# Patient Record
Sex: Female | Born: 1955 | Race: Black or African American | Hispanic: No | Marital: Married | Smoking: Current every day smoker
Health system: Southern US, Community
[De-identification: ages and names within clinical notes are randomized; demographics above are authoritative.]

## PROBLEM LIST (undated history)

## (undated) DIAGNOSIS — I10 Essential (primary) hypertension: Secondary | ICD-10-CM

## (undated) DIAGNOSIS — I341 Nonrheumatic mitral (valve) prolapse: Secondary | ICD-10-CM

## (undated) DIAGNOSIS — Z5189 Encounter for other specified aftercare: Secondary | ICD-10-CM

## (undated) DIAGNOSIS — I519 Heart disease, unspecified: Secondary | ICD-10-CM

## (undated) DIAGNOSIS — F32A Depression, unspecified: Secondary | ICD-10-CM

## (undated) DIAGNOSIS — K5010 Crohn's disease of large intestine without complications: Secondary | ICD-10-CM

## (undated) DIAGNOSIS — N979 Female infertility, unspecified: Secondary | ICD-10-CM

## (undated) DIAGNOSIS — F419 Anxiety disorder, unspecified: Secondary | ICD-10-CM

## (undated) DIAGNOSIS — F329 Major depressive disorder, single episode, unspecified: Secondary | ICD-10-CM

## (undated) HISTORY — PX: WISDOM TOOTH EXTRACTION: SHX21

## (undated) HISTORY — DX: Female infertility, unspecified: N97.9

## (undated) HISTORY — DX: Heart disease, unspecified: I51.9

## (undated) HISTORY — DX: Encounter for other specified aftercare: Z51.89

## (undated) HISTORY — DX: Depression, unspecified: F32.A

## (undated) HISTORY — DX: Essential (primary) hypertension: I10

## (undated) HISTORY — DX: Major depressive disorder, single episode, unspecified: F32.9

## (undated) HISTORY — PX: TUBAL LIGATION: SHX77

## (undated) HISTORY — DX: Anxiety disorder, unspecified: F41.9

---

## 2005-04-21 ENCOUNTER — Emergency Department (HOSPITAL_COMMUNITY): Admission: EM | Admit: 2005-04-21 | Discharge: 2005-04-21 | Payer: Self-pay | Admitting: Emergency Medicine

## 2005-07-25 ENCOUNTER — Emergency Department (HOSPITAL_COMMUNITY): Admission: EM | Admit: 2005-07-25 | Discharge: 2005-07-25 | Payer: Self-pay | Admitting: Emergency Medicine

## 2005-08-25 ENCOUNTER — Other Ambulatory Visit: Admission: RE | Admit: 2005-08-25 | Discharge: 2005-08-25 | Payer: Self-pay | Admitting: Obstetrics and Gynecology

## 2006-02-03 ENCOUNTER — Emergency Department (HOSPITAL_COMMUNITY): Admission: EM | Admit: 2006-02-03 | Discharge: 2006-02-03 | Payer: Self-pay | Admitting: Family Medicine

## 2006-05-26 ENCOUNTER — Other Ambulatory Visit: Admission: RE | Admit: 2006-05-26 | Discharge: 2006-05-26 | Payer: Self-pay | Admitting: Family Medicine

## 2007-03-24 ENCOUNTER — Emergency Department (HOSPITAL_COMMUNITY): Admission: EM | Admit: 2007-03-24 | Discharge: 2007-03-24 | Payer: Self-pay | Admitting: Emergency Medicine

## 2009-03-03 ENCOUNTER — Emergency Department (HOSPITAL_BASED_OUTPATIENT_CLINIC_OR_DEPARTMENT_OTHER): Admission: EM | Admit: 2009-03-03 | Discharge: 2009-03-03 | Payer: Self-pay | Admitting: Emergency Medicine

## 2009-03-03 ENCOUNTER — Ambulatory Visit: Payer: Self-pay | Admitting: Diagnostic Radiology

## 2009-05-08 ENCOUNTER — Other Ambulatory Visit: Payer: Self-pay | Admitting: Emergency Medicine

## 2009-05-09 ENCOUNTER — Inpatient Hospital Stay (HOSPITAL_COMMUNITY): Admission: EM | Admit: 2009-05-09 | Discharge: 2009-05-11 | Payer: Self-pay | Admitting: Internal Medicine

## 2009-05-10 ENCOUNTER — Encounter (INDEPENDENT_AMBULATORY_CARE_PROVIDER_SITE_OTHER): Payer: Self-pay | Admitting: Gastroenterology

## 2009-05-21 ENCOUNTER — Encounter (HOSPITAL_COMMUNITY): Admission: RE | Admit: 2009-05-21 | Discharge: 2009-07-21 | Payer: Self-pay | Admitting: Gastroenterology

## 2010-11-19 LAB — CBC
HCT: 33.7 % — ABNORMAL LOW (ref 36.0–46.0)
MCV: 93.1 fL (ref 78.0–100.0)
RBC: 3.62 MIL/uL — ABNORMAL LOW (ref 3.87–5.11)
WBC: 8.1 10*3/uL (ref 4.0–10.5)

## 2010-11-19 LAB — CROSSMATCH

## 2010-11-19 LAB — DIFFERENTIAL
Eosinophils Absolute: 0.1 10*3/uL (ref 0.0–0.7)
Lymphocytes Relative: 35 % (ref 12–46)
Lymphs Abs: 2.8 10*3/uL (ref 0.7–4.0)
Monocytes Relative: 5 % (ref 3–12)
Neutrophils Relative %: 58 % (ref 43–77)

## 2010-11-19 LAB — ABO/RH: ABO/RH(D): O POS

## 2010-11-20 LAB — URINE MICROSCOPIC-ADD ON

## 2010-11-20 LAB — CROSSMATCH
ABO/RH(D): O POS
Antibody Screen: NEGATIVE

## 2010-11-20 LAB — COMPREHENSIVE METABOLIC PANEL
ALT: 12 U/L (ref 0–35)
AST: 15 U/L (ref 0–37)
AST: 20 U/L (ref 0–37)
Albumin: 3.5 g/dL (ref 3.5–5.2)
Alkaline Phosphatase: 74 U/L (ref 39–117)
BUN: 12 mg/dL (ref 6–23)
CO2: 30 mEq/L (ref 19–32)
Calcium: 8.8 mg/dL (ref 8.4–10.5)
Chloride: 107 mEq/L (ref 96–112)
Chloride: 106 mEq/L (ref 96–112)
Creatinine, Ser: 0.93 mg/dL (ref 0.4–1.2)
GFR calc Af Amer: >60 mL/min (ref >60)
GFR calc Af Amer: >60 mL/min (ref >60)
GFR calc non Af Amer: >60 mL/min (ref >60)
Glucose, Bld: 99 mg/dL (ref 70–99)
Potassium: 3.5 mEq/L (ref 3.5–5.1)
Sodium: 141 mEq/L (ref 135–145)
Total Bilirubin: 0.4 mg/dL (ref 0.3–1.2)
Total Bilirubin: 0.2 mg/dL — ABNORMAL LOW (ref 0.3–1.2)
Total Protein: 6.8 g/dL (ref 6.0–8.3)

## 2010-11-20 LAB — CBC
HCT: 34.7 % — ABNORMAL LOW (ref 36.0–46.0)
Hemoglobin: 10.4 g/dL — ABNORMAL LOW (ref 12.0–15.0)
MCHC: 32.5 g/dL (ref 30.0–36.0)
MCHC: 32.8 g/dL (ref 30.0–36.0)
MCHC: 32.9 g/dL (ref 30.0–36.0)
MCV: 92.2 fL (ref 78.0–100.0)
MCV: 92.8 fL (ref 78.0–100.0)
MCV: 93.1 fL (ref 78.0–100.0)
Platelets: 200 10*3/uL (ref 150–400)
Platelets: 228 10*3/uL (ref 150–400)
RBC: 3.45 MIL/uL — ABNORMAL LOW (ref 3.87–5.11)
RBC: 3.73 MIL/uL — ABNORMAL LOW (ref 3.87–5.11)
RDW: 12.8 % (ref 11.5–15.5)
WBC: 6.3 10*3/uL (ref 4.0–10.5)
WBC: 7.6 10*3/uL (ref 4.0–10.5)

## 2010-11-20 LAB — BASIC METABOLIC PANEL
CO2: 25 mEq/L (ref 19–32)
Chloride: 109 mEq/L (ref 96–112)
Creatinine, Ser: 0.83 mg/dL (ref 0.4–1.2)
GFR calc Af Amer: >60 mL/min (ref >60)
Sodium: 140 mEq/L (ref 135–145)

## 2010-11-20 LAB — DIFFERENTIAL
Basophils Absolute: 0 10*3/uL (ref 0.0–0.1)
Basophils Relative: 1 % (ref 0–1)
Eosinophils Relative: 2 % (ref 0–5)
Lymphocytes Relative: 55 % — ABNORMAL HIGH (ref 12–46)
Lymphs Abs: 4.1 10*3/uL — ABNORMAL HIGH (ref 0.7–4.0)
Monocytes Absolute: 0.4 10*3/uL (ref 0.1–1.0)
Monocytes Relative: 5 % (ref 3–12)
Neutro Abs: 2.9 10*3/uL (ref 1.7–7.7)
Neutro Abs: 3.2 10*3/uL (ref 1.7–7.7)
Neutrophils Relative %: 38 % — ABNORMAL LOW (ref 43–77)

## 2010-11-20 LAB — HEMOGLOBIN AND HEMATOCRIT, BLOOD
HCT: 30.2 % — ABNORMAL LOW (ref 36.0–46.0)
Hemoglobin: 9.4 g/dL — ABNORMAL LOW (ref 12.0–15.0)
Hemoglobin: 9.7 g/dL — ABNORMAL LOW (ref 12.0–15.0)

## 2010-11-20 LAB — URINALYSIS, ROUTINE W REFLEX MICROSCOPIC
Bilirubin Urine: NEGATIVE
Glucose, UA: NEGATIVE mg/dL
Leukocytes, UA: NEGATIVE
Nitrite: NEGATIVE
Specific Gravity, Urine: 1.009 (ref 1.005–1.030)
pH: 6 (ref 5.0–8.0)

## 2010-11-20 LAB — HEMOCCULT GUIAC POC 1CARD (OFFICE): Fecal Occult Bld: POSITIVE

## 2010-11-20 LAB — APTT: aPTT: 26 seconds (ref 24–37)

## 2010-11-22 LAB — URINALYSIS, ROUTINE W REFLEX MICROSCOPIC
Ketones, ur: NEGATIVE mg/dL
Protein, ur: NEGATIVE mg/dL
Urobilinogen, UA: 0.2 mg/dL (ref 0.0–1.0)

## 2010-11-22 LAB — COMPREHENSIVE METABOLIC PANEL
ALT: 9 U/L (ref 0–35)
AST: 25 U/L (ref 0–37)
Calcium: 9.1 mg/dL (ref 8.4–10.5)
GFR calc Af Amer: >60 mL/min (ref >60)
Sodium: 141 mEq/L (ref 135–145)
Total Protein: 7.8 g/dL (ref 6.0–8.3)

## 2010-11-22 LAB — DIFFERENTIAL
Eosinophils Absolute: 0.1 10*3/uL (ref 0.0–0.7)
Eosinophils Relative: 2 % (ref 0–5)
Lymphs Abs: 3.1 10*3/uL (ref 0.7–4.0)
Monocytes Absolute: 0.2 10*3/uL (ref 0.1–1.0)
Monocytes Relative: 3 % (ref 3–12)

## 2010-11-22 LAB — CBC
MCHC: 33.9 g/dL (ref 30.0–36.0)
RBC: 4 MIL/uL (ref 3.87–5.11)
RDW: 12.7 % (ref 11.5–15.5)

## 2010-11-22 LAB — GLUCOSE, CAPILLARY: Glucose-Capillary: 115 mg/dL — ABNORMAL HIGH (ref 70–99)

## 2010-11-22 LAB — URINE MICROSCOPIC-ADD ON

## 2011-02-01 IMAGING — CT CT HEAD W/O CM
1 series · 16 of 30 positions shown, 20 images · non-contrast
Comparison: None.

CLINICAL DATA: Dizziness.  Left arm, leg and facial numbness and
tingling since yesterday.  Hypertension.  Anxiety.  Obesity.

CT HEAD WITHOUT CONTRAST
TECHNIQUE: Contiguous axial images were obtained from the base of
the skull through the vertex without contrast.

[Series 2: head 4.8 h37s · axial · 0.44mm/px · z∈[-92,+44]mm · 16 of 32 slices shown, 20 images]
[im 2/32  brain]
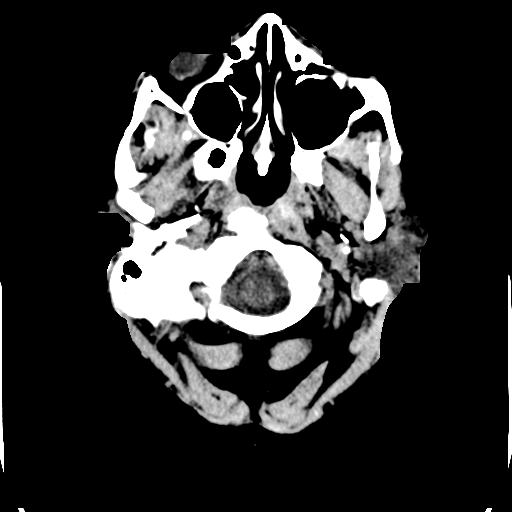
[im 2/32  bone]
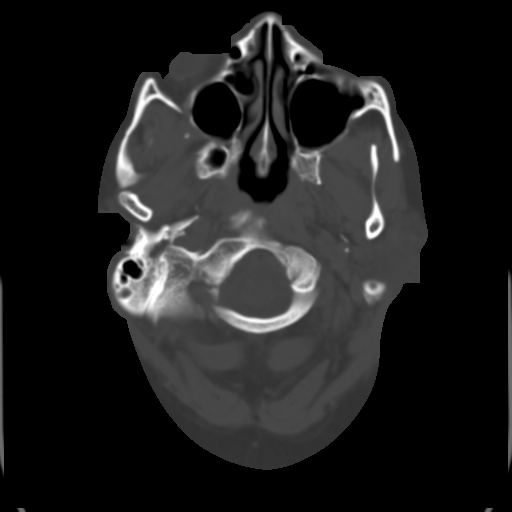
[im 4/32  brain]
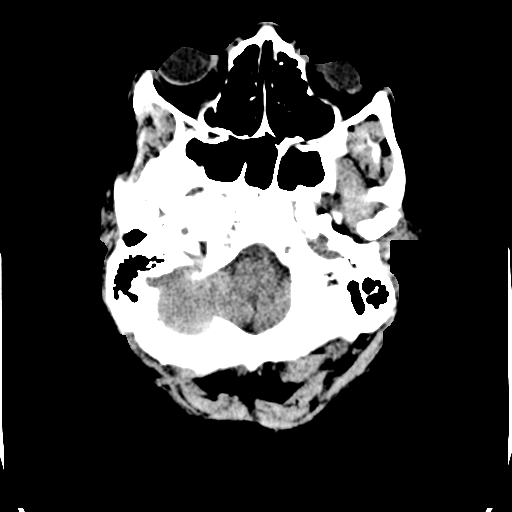
[im 6/32  brain]
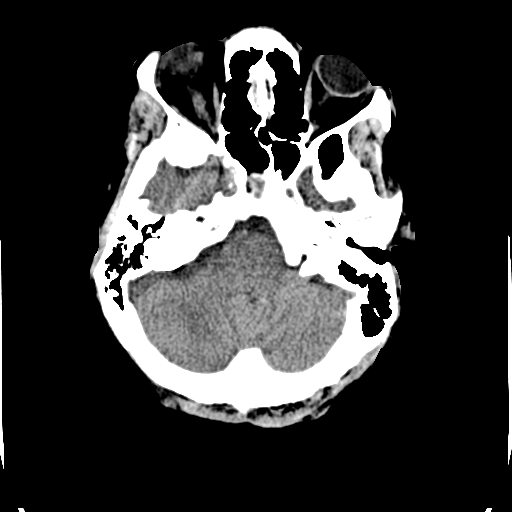
[im 8/32  brain]
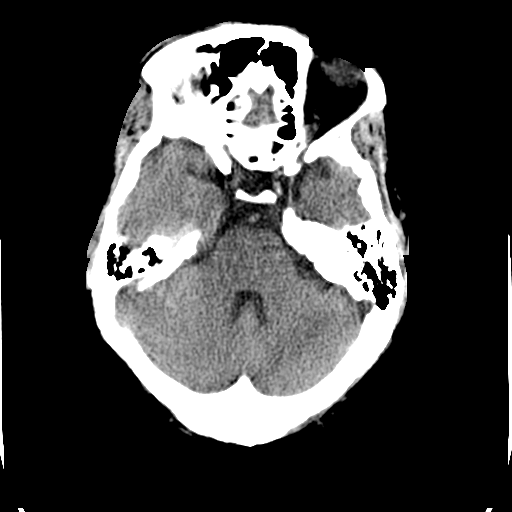
[im 9/32  brain]
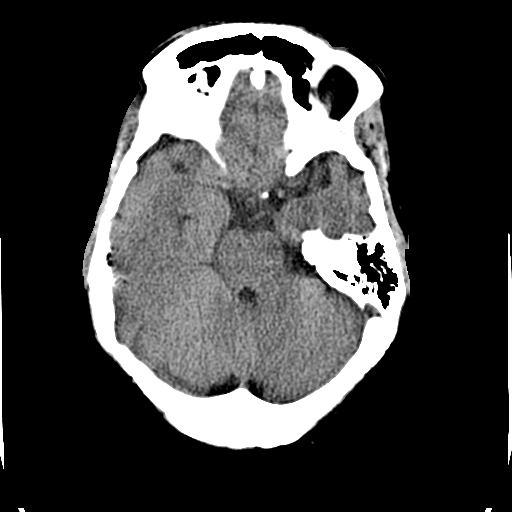
[im 9/32  bone]
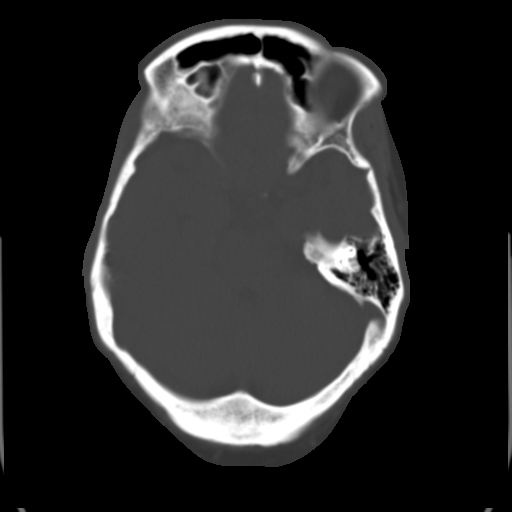
[im 11/32  brain]
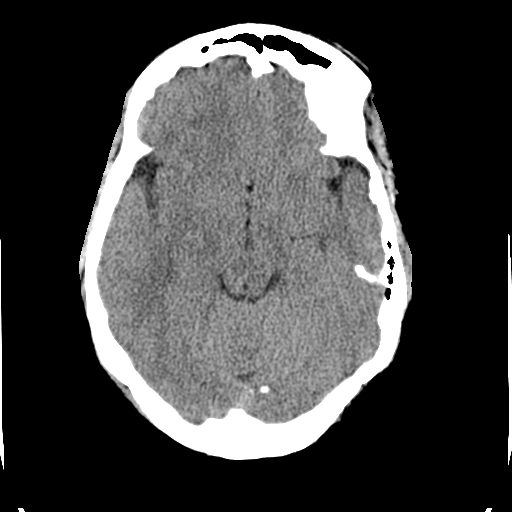
[im 13/32  brain]
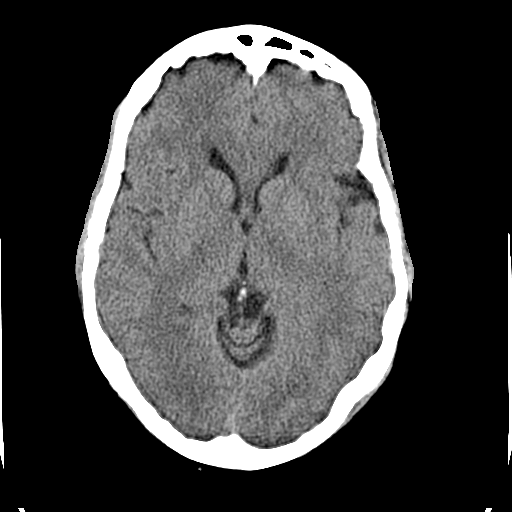
[im 15/32  brain]
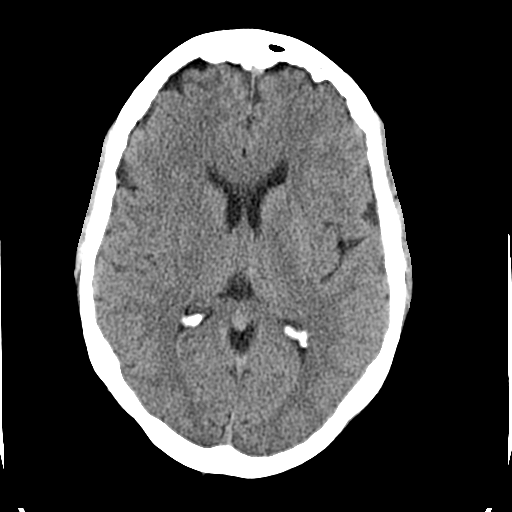
[im 17/32  brain]
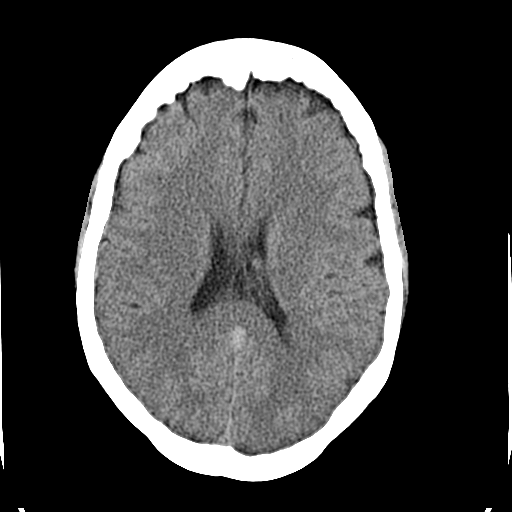
[im 17/32  bone]
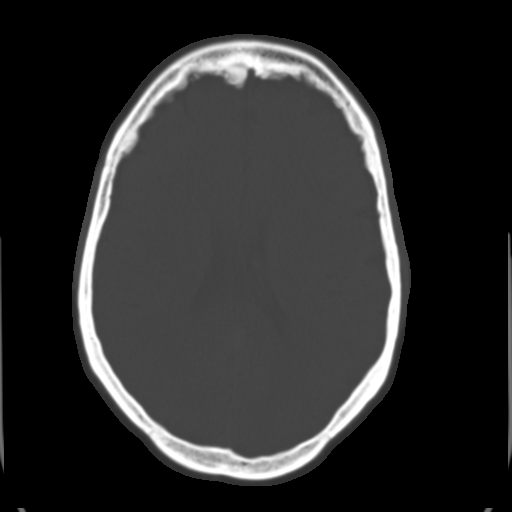
[im 19/32  brain]
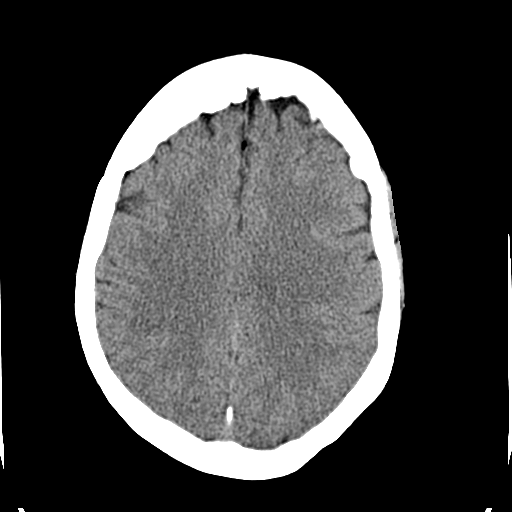
[im 21/32  brain]
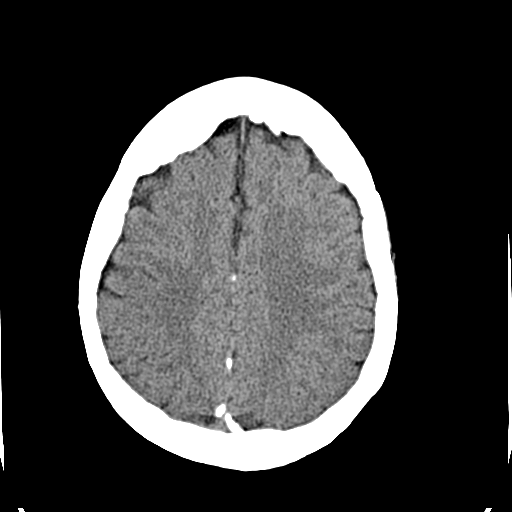
[im 23/32  brain]
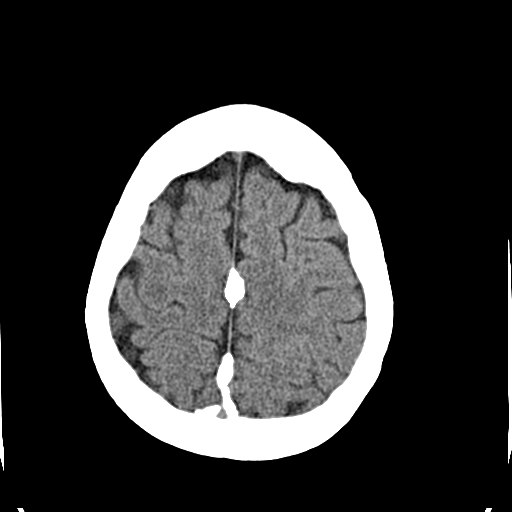
[im 24/32  brain]
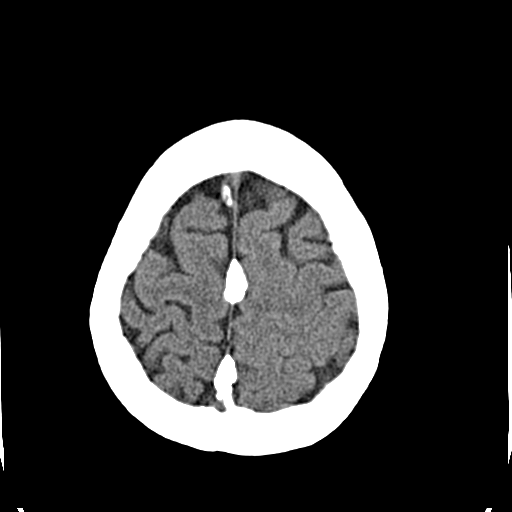
[im 24/32  bone]
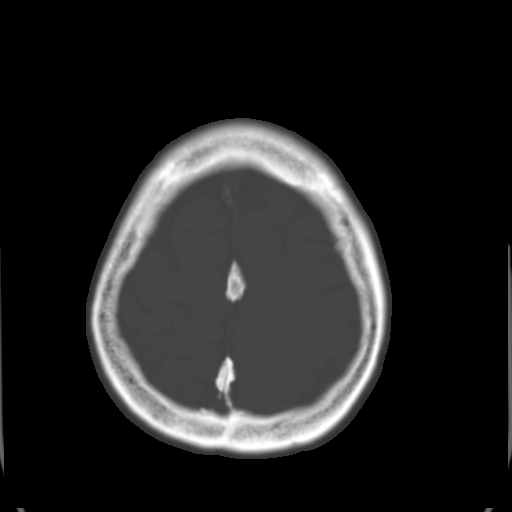
[im 26/32  brain]
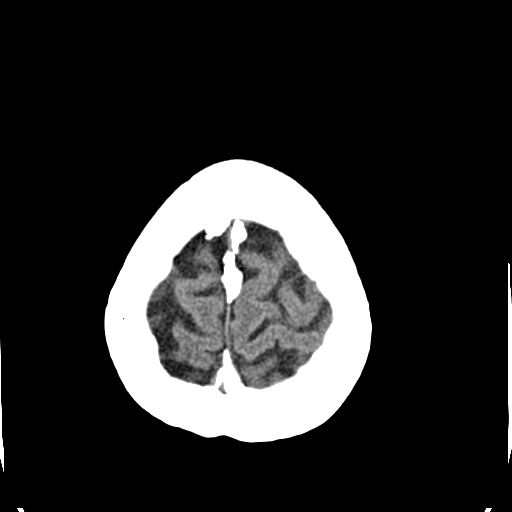
[im 28/32  brain]
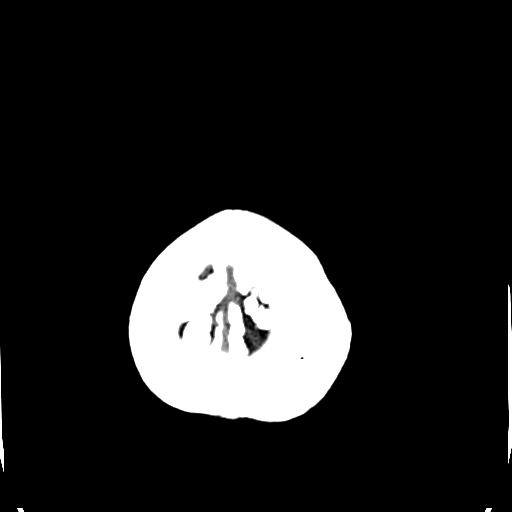
[im 30/32  brain]
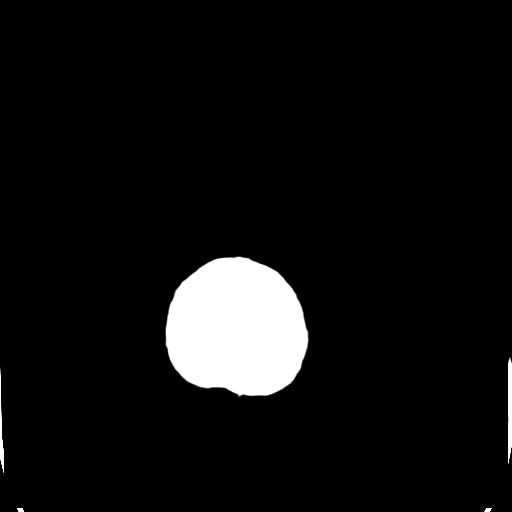

[16 of 30 positions shown; findings below may reference images not displayed]

FINDINGS: Normal appearing cerebral hemispheres and posterior fossa
structures.  Normal size and position of the ventricles.  No
intracranial hemorrhage, mass lesion or evidence of acute
infarction.  Unremarkable bones and included portions of the
paranasal sinuses.
IMPRESSION: Normal examination.

## 2011-05-31 LAB — I-STAT 8, (EC8 V) (CONVERTED LAB)
Acid-Base Excess: 2
BUN: 16
Chloride: 105
HCT: 43
Hemoglobin: 14.6
Operator id: 282151
Potassium: 3.7
Sodium: 140
pCO2, Ven: 43.3 — ABNORMAL LOW

## 2011-05-31 LAB — POCT I-STAT CREATININE
Creatinine, Ser: 1.1
Operator id: 282151

## 2013-12-14 ENCOUNTER — Observation Stay: Payer: Self-pay | Admitting: Internal Medicine

## 2013-12-14 LAB — BASIC METABOLIC PANEL
ANION GAP: 6 — AB (ref 7–16)
BUN: 10 mg/dL (ref 7–18)
CO2: 29 mmol/L (ref 21–32)
Calcium, Total: 9.1 mg/dL (ref 8.5–10.1)
Chloride: 105 mmol/L (ref 98–107)
Creatinine: 0.81 mg/dL (ref 0.60–1.30)
GLUCOSE: 83 mg/dL (ref 65–99)
Osmolality: 278 (ref 275–301)
POTASSIUM: 3.9 mmol/L (ref 3.5–5.1)
Sodium: 140 mmol/L (ref 136–145)

## 2013-12-14 LAB — HEPATIC FUNCTION PANEL A (ARMC)
ALK PHOS: 52 U/L
Albumin: 2.9 g/dL — ABNORMAL LOW (ref 3.4–5.0)
BILIRUBIN TOTAL: 0.3 mg/dL (ref 0.2–1.0)
Bilirubin, Direct: <0.1 mg/dL (ref 0.00–0.20)
SGOT(AST): 15 U/L (ref 15–37)
SGPT (ALT): 13 U/L (ref 12–78)
TOTAL PROTEIN: 8 g/dL (ref 6.4–8.2)

## 2013-12-14 LAB — TROPONIN I
Troponin-I: <0.02 ng/mL
Troponin-I: <0.02 ng/mL

## 2013-12-14 LAB — CBC
HCT: 40.7 % (ref 35.0–47.0)
HGB: 13 g/dL (ref 12.0–16.0)
MCH: 29 pg (ref 26.0–34.0)
MCHC: 32 g/dL (ref 32.0–36.0)
MCV: 91 fL (ref 80–100)
Platelet: 208 10*3/uL (ref 150–440)
RBC: 4.49 10*6/uL (ref 3.80–5.20)
RDW: 13.5 % (ref 11.5–14.5)
WBC: 5.6 10*3/uL (ref 3.6–11.0)

## 2013-12-14 LAB — CK-MB
CK-MB: 0.6 ng/mL (ref 0.5–3.6)
CK-MB: 0.8 ng/mL (ref 0.5–3.6)

## 2013-12-14 LAB — LIPID PANEL
CHOLESTEROL: 229 mg/dL — AB (ref 0–200)
HDL Cholesterol: 62 mg/dL — ABNORMAL HIGH (ref 40–60)
LDL CHOLESTEROL, CALC: 154 mg/dL — AB (ref 0–100)
Triglycerides: 64 mg/dL (ref 0–200)
VLDL Cholesterol, Calc: 13 mg/dL (ref 5–40)

## 2013-12-14 LAB — LIPASE, BLOOD: LIPASE: 104 U/L (ref 73–393)

## 2014-01-17 DIAGNOSIS — R079 Chest pain, unspecified: Secondary | ICD-10-CM | POA: Insufficient documentation

## 2014-01-17 DIAGNOSIS — E785 Hyperlipidemia, unspecified: Secondary | ICD-10-CM | POA: Insufficient documentation

## 2014-01-17 DIAGNOSIS — I10 Essential (primary) hypertension: Secondary | ICD-10-CM | POA: Insufficient documentation

## 2014-01-17 DIAGNOSIS — I341 Nonrheumatic mitral (valve) prolapse: Secondary | ICD-10-CM | POA: Insufficient documentation

## 2014-12-07 NOTE — Discharge Summary (Signed)
PATIENT NAME:  Carla BussingWILSON FOWLKES, Jesse MR#:  [id number] DATE OF BIRTH:  [birth year]  DATE OF ADMISSION:  12/14/2013 DATE OF DISCHARGE:  12/15/2013  CHIEF COMPLAINT: Chest pain.   DISCHARGE DIAGNOSES:  1. Chest pain, resolved, appears atypical.  2. Rash.  3. Hypertension.  4. Hyperlipidemia.  5. Tobacco abuse.   CODE STATUS: Full code.   MEDICATIONS:  1. Pravastatin 10 mg at bedtime.  2. Hydrochlorothiazide/lisinopril 12.5/20 one tablet b.i.d.   3. Metoprolol 25 mg b.i.d.  4. Amlodipine 10 mg daily.  5. Triamcinolone 0.5% topical ointment 1 apply b.i.d.   DISCHARGE DIET: Low-sodium.   DISCHARGE INSTRUCTIONS:  Establish primary care physician in the area.   LABORATORY DATA: Cardiac enzymes x 3 negative. Myoview stress test within normal limits.   BRIEF SUMMARY OF HOSPITAL COURSE: The patient is a 59 year old pleasant African American female with history of tobacco abuse and hypertension comes in with chest pain, which appeared to be atypical. She did not have any acute EKG changes. Cardiac enzymes remained negative. She was continued on beta blockers and statins.  UNABLE TO GIVE ASPIRIN DUE TO ALLERGY. She did not have any episodes of chest pain.  1. Axillary hypertension. The patient was advised low-salt diet, exercise, weight reduction, and started on beta blockers, hydrochlorothiazide/lisinopril and amlodipine.  2. Hyperlipidemia. On statins.  3. Bilateral lower extremity itchy rash appears to be, along with hyperpigmented patches, likely eczema. Triamcinolone ointment was given.   Hospital stay otherwise remained stable.   CODE STATUS: The patient remained a full code.   TIME SPENT: 40 minutes.    ____________________________ Wylie HailSona A. Allena KatzPatel, MD sap:dd/am D: 12/18/2013 14:33:12 ET T: 12/19/2013 00:00:32 ET JOB#: [id number]  cc: Siera Beyersdorf A. Allena KatzPatel, MD, <Dictator> Willow OraSONA A Vicky Schleich MD ELECTRONICALLY SIGNED 01/06/2014 16:11

## 2014-12-07 NOTE — Consult Note (Signed)
PATIENT NAME:  Carla Tyler, Carla Tyler MR#:  [id number] DATE OF BIRTH:  1956-07-16  DATE OF CONSULTATION:  12/15/2013  CONSULTING PHYSICIAN:  Marcina MillardAlexander Yatzari Jonsson, MD  PRIMARY CARE PHYSICIAN: None.  CHIEF COMPLAINT: Lower extremity rash.   REASON FOR CONSULTATION: Consultation requested for evaluation of chest discomfort.   HISTORY OF PRESENT ILLNESS: The patient is a 59 year old female with history of hypertension, hyperlipidemia, and mitral valve prolapse. The patient presented to [facility] Emergency Room with chief complaint of rash in both lower extremities. In the Emergency Room, the patient reports that she has had some intermittent chest discomfort. The patient has had a history of mitral valve prolapse and intermittent chest discomfort described as a tightness sensation. In the Emergency Room, she reported that she had some radiation to her jaw. The patient reports that she has had prior chest pain in the past. The patient was admitted to telemetry, where she was ruled out for myocardial infarction by CPK, isoenzymes, and troponin. The patient has not been taking her medications or seeing a primary doctor due to financial concerns. The patient underwent ECD sestamibi study earlier today. The patient did not experience any chest pain on the treadmill. Sestamibi scintigraphy revealed a moderate reversible apical wall defect consistent with ischemia.   PAST MEDICAL HISTORY: 1.  Hypertension.  2.  Hyperlipidemia.  3.  Mitral valve prolapse.  4.  Anxiety.   MEDICATIONS: None.   SOCIAL HISTORY: The patient is married. She smokes less than a pack of cigarettes a day.   FAMILY HISTORY: Positive for coronary artery disease.   REVIEW OF SYSTEMS:    CONSTITUTIONAL: No fever or chills.  EYES: No blurry vision.  EARS: No hearing loss.  RESPIRATORY: No shortness of breath.  CARDIOVASCULAR: Chest discomfort with mitral valve prolapse as described above.  GASTROINTESTINAL: No nausea, vomiting, or  diarrhea.  GENITOURINARY: No dysuria or hematuria.  ENDOCRINE: No polyuria or polydipsia.  MUSCULOSKELETAL: No arthralgias or myalgias.  INTEGUMENTARY: The patient has had a rash over both lower extremities and over her shoulders.  NEUROLOGICAL: No focal muscle weakness or numbness.  PSYCHOLOGICAL: No depression or anxiety.   PHYSICAL EXAMINATION: VITAL SIGNS: Blood pressure 137/87, pulse 61, respirations 18, temperature 97.7, pulse oximetry 96%.  HEENT: Pupils equal and reactive to light and accommodation.  NECK: Supple without thyromegaly.  LUNGS: Clear.  HEART: Normal JVP. Normal PMI. Regular rate and rhythm. Normal S1, S2. No appreciable gallop, murmur, or rub.  ABDOMEN: Soft, nontender.  EXTREMITIES: Pulses were intact bilaterally.  MUSCULOSKELETAL: Normal muscle tone.  NEUROLOGICAL: The patient is alert and oriented x 3. Motor and sensory both grossly intact.   IMPRESSION: This is a 59 year old female with known history of hypertension, hyperlipidemia, has been lost to medical followup, currently on no medications, who presents primarily with rash, with intermittent chest pain with typical and atypical features, with ECD sestamibi of low to intermediate risk. Blood pressure much improved after starting medications.   RECOMMENDATIONS: 1.  Agree with overall current therapy.  2.  Would defer therapeutic heparin. 3.  Will defer cardiac catheterization at this time. Will follow up in one week. If patient continues to have symptoms of chest pain, then we will proceed with cardiac catheterization.    ____________________________ Marcina MillardAlexander Tina Temme, MD ap:jcm D: 12/15/2013 14:45:56 ET T: 12/15/2013 19:00:51 ET JOB#: [id number]  cc: Marcina MillardAlexander Wenceslao Loper, MD, <Dictator> Marcina MillardALEXANDER Luwana Butrick MD ELECTRONICALLY SIGNED 01/14/2014 15:02

## 2014-12-07 NOTE — H&P (Signed)
PATIENT NAME:  Tyler Tyler, Britt MR#:  [id number] DATE OF BIRTH:  May 21, 1956  DATE OF ADMISSION:  12/14/2013  PRIMARY CARE PHYSICIAN:  None.  CHIEF COMPLAINT: Chest pain for 2 days. Rash on bilateral lower extremity.   HISTORY OF PRESENT ILLNESS: Tyler Tyler is a 59 year old morbidly obese African American female with history of hypertension, hyperlipidemia, which has been untreated due to financial reasons, comes to the Emergency Room after she started having some chest discomfort and "fluttering of her heart yesterday." The patient said the symptoms faded out on its own yesterday, came back again this morning with some chest tightness. She is chest pain free right now. She has history of mitral valve prolapse. The patient also has developed some itchy rash over her bilateral shoulders and bilateral ankle area. She is hemodynamically stable. Her EKG does not show any acute ST elevation or depression. First set of cardiac enzymes is negative. She is being admitted for further evaluation and management.   PAST MEDICAL HISTORY: 1.  Hypertension.  2.  Hyperlipidemia.  3.  Mitral valve prolapse.  4.  Anxiety.   MEDICATIONS: None.   ALLERGIES: ASPIRIN AND PENICILLIN.   FAMILY HISTORY: Positive for CAD and diabetes.   SOCIAL HISTORY: The patient smokes about less than a pack a day. Denies alcohol or drug use.  REVIEW OF SYSTEMS:  CONSTITUTIONAL: No fever, fatigue, weakness.  EYES: No blurred or double vision.  EARS, NOSE, THROAT:  No tinnitus, ear pain, hearing loss.  RESPIRATORY: No cough, wheeze, hemoptysis.  CARDIOVASCULAR: Positive for chest pain earlier today. Positive for hypertension. No arrhythmia or syncope.  GASTROINTESTINAL: No nausea, vomiting, diarrhea, abdominal pain or melena. GENITOURINARY:  No dysuria, hematuria or frequency.  ENDOCRINE: No polyuria, nocturia or thyroid problems.  HEMATOLOGY: No anemia or easy bruising.  SKIN: Positive for some itchy rash over the  shoulders and bilateral ankle.  NEUROLOGIC: No CVA, TIA, dysarthria or vertigo.  PSYCHIATRIC: No anxiety or depression.  All other systems reviewed and negative.   PHYSICAL EXAMINATION: GENERAL: The patient is awake, alert, oriented x 3, not in any acute distress.  VITAL SIGNS: Afebrile. Pulse is 68. Blood pressure is 199/98, sats are 98% on room air.  HEENT: Atraumatic, normocephalic. PERRLA. EOM intact. Oral mucosa is moist.  NECK: Supple. No JVD. No carotid bruit.  RESPIRATORY: Clear to auscultation bilaterally. No rales, rhonchi, respiratory distress or labored breathing.  CARDIOVASCULAR: Both heart sounds are normal. Rate, rhythm is regular. PMI not lateralized. Chest nontender.  EXTREMITIES: Good pedal pulses. Good femoral pulses. No lower extremity edema.  ABDOMEN: Soft, benign, nontender. No organomegaly. Positive bowel sounds.  NEUROLOGIC: Grossly intact cranial nerves II through XII. No motor or sensory deficit.  PSYCHIATRIC: The patient is awake, alert, oriented x 3.  SKIN: The patient has some itchy eczematous rash, patchy areas of rash in bilateral lower extremities, more above the left lateral malleolus. She also some patchy rash around the bilateral shoulders.  EKG shows normal sinus rhythm, LVH. Some nonspecific ST and T changes.  Chest x-ray shows no acute cardiopulmonary abnormality. Bullous disease on the right apex. Troponin is 0.02. CBC and basic metabolic panel within normal limits. Hepatic function panel within normal limits. Lipase is 104. Cholesterol is 229, triglycerides 64, LDL is 154.   ASSESSMENT AND PLAN: A 59 year old, Tyler Tyler, with past medical history of hypertension, hyperlipidemia and anxiety comes in with:  1.  Chest pain. Appears to be atypical at this time. The patient, however, has risk factor of  hypertension and hyperlipidemia. We will admit her on telemetry floor, cycle cardiac enzymes x 3. Continue aspirin and beta blockers, statins, as-needed  nitroglycerin.  4.  Accelerated uncontrolled, untreated hypertension. Will start the patient on beta blockers, hydrochlorothiazide/lisinopril. The patient is advised low-salt diet and exercise with weight reduction.  5.  Hyperlipidemia. Statins will be started.  6.  Deep vein thrombosis prophylaxis. Subcutaneous heparin three times daily.  7.  Further workup per the patient's clinical course. Hospital admission plan was discussed with the patient. 8.  Bilateral lower extremity itchy rash appears to be with hyperpigmented patches, likely is eczema. We will start her on some triamcinolone ointment.    TIME SPENT: 40 minutes.    ____________________________ Wylie Hail Allena Katz, MD sap:ce D: 12/14/2013 15:09:31 ET T: 12/14/2013 16:05:44 ET JOB#: [id number]  cc: Azari Hasler A. Allena Katz, MD, <Dictator> Willow Ora MD ELECTRONICALLY SIGNED 01/06/2014 16:10

## 2015-01-23 ENCOUNTER — Other Ambulatory Visit: Payer: Self-pay | Admitting: Family Medicine

## 2015-01-23 DIAGNOSIS — Z1231 Encounter for screening mammogram for malignant neoplasm of breast: Secondary | ICD-10-CM

## 2015-01-29 ENCOUNTER — Ambulatory Visit: Payer: Self-pay

## 2015-11-14 IMAGING — CR DG CHEST 2V
1 series · 3 of 3 positions shown · non-contrast
Comparison: None.

CLINICAL DATA: Chest pain

EXAM:
CHEST  2 VIEW

[Series 1: w chest pa · 0.14mm/px · 3 of 3 slices shown]
[im 1/3]
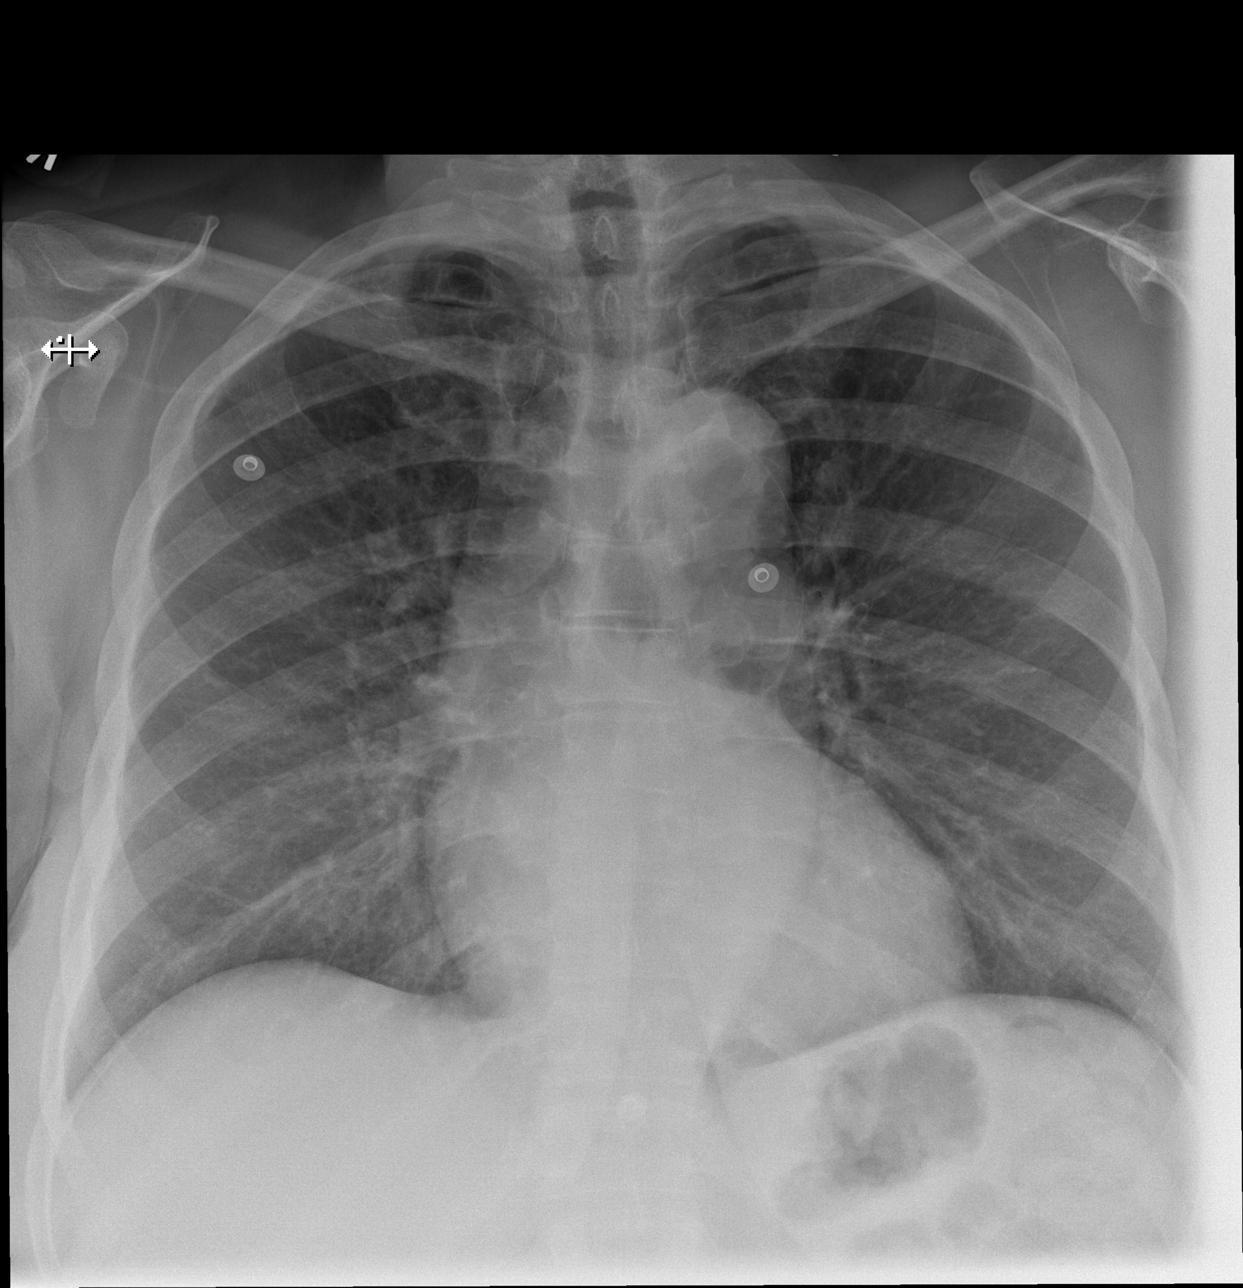
[im 2/3]
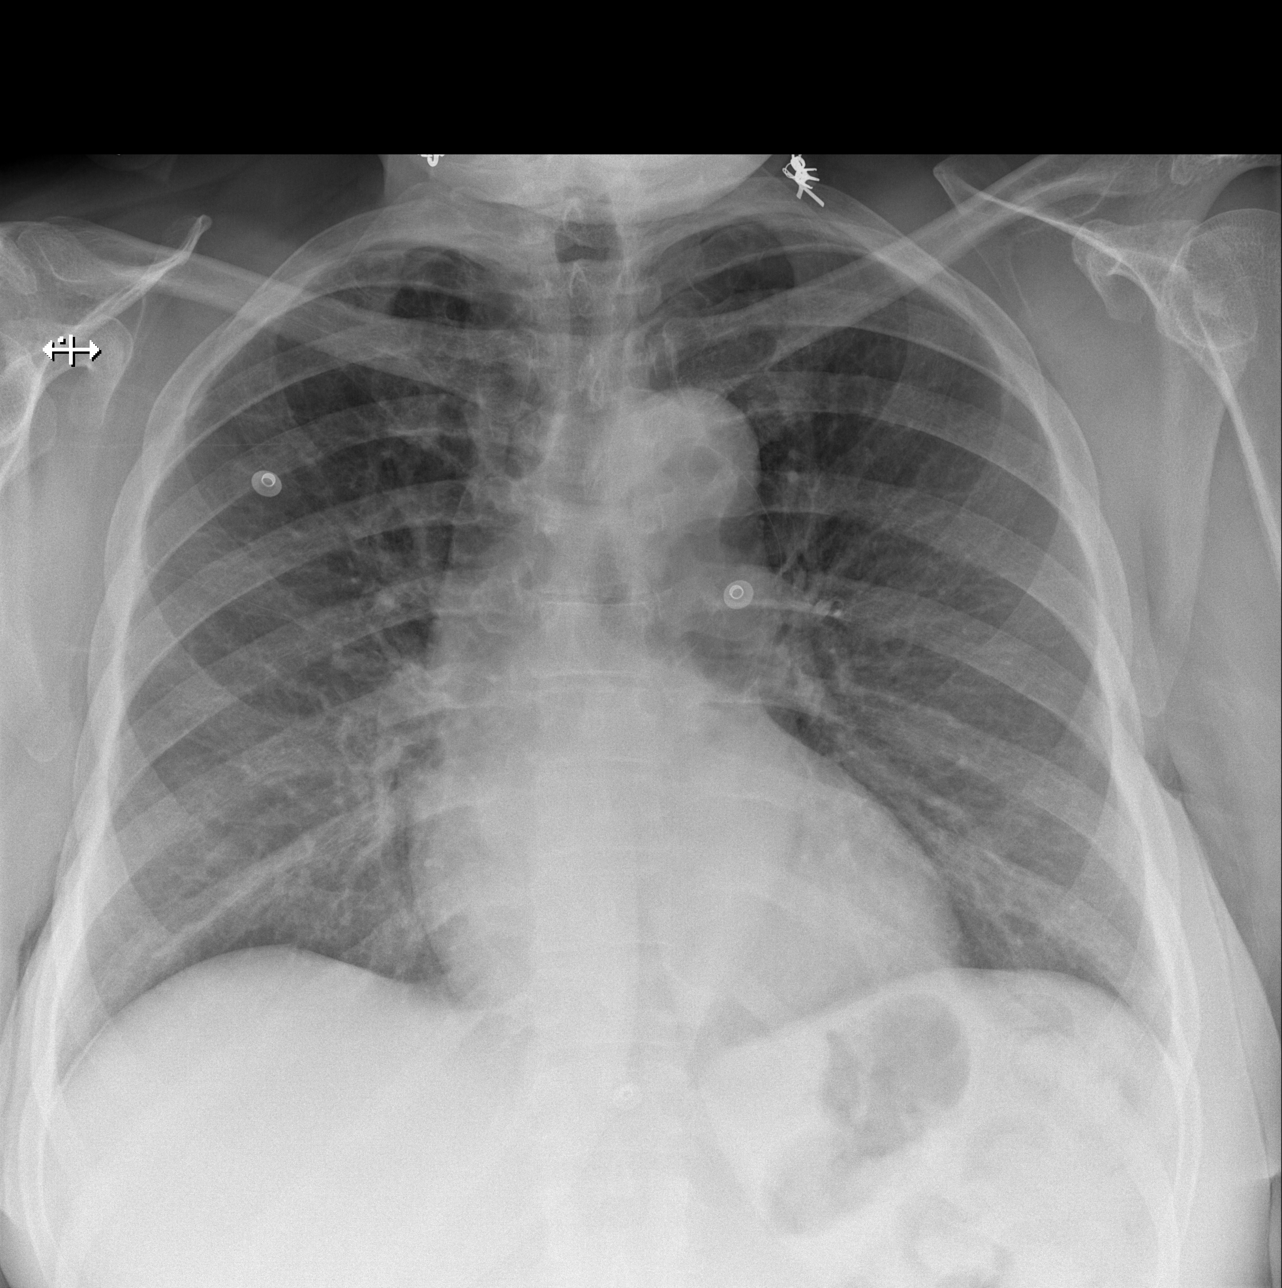
[im 3/3]
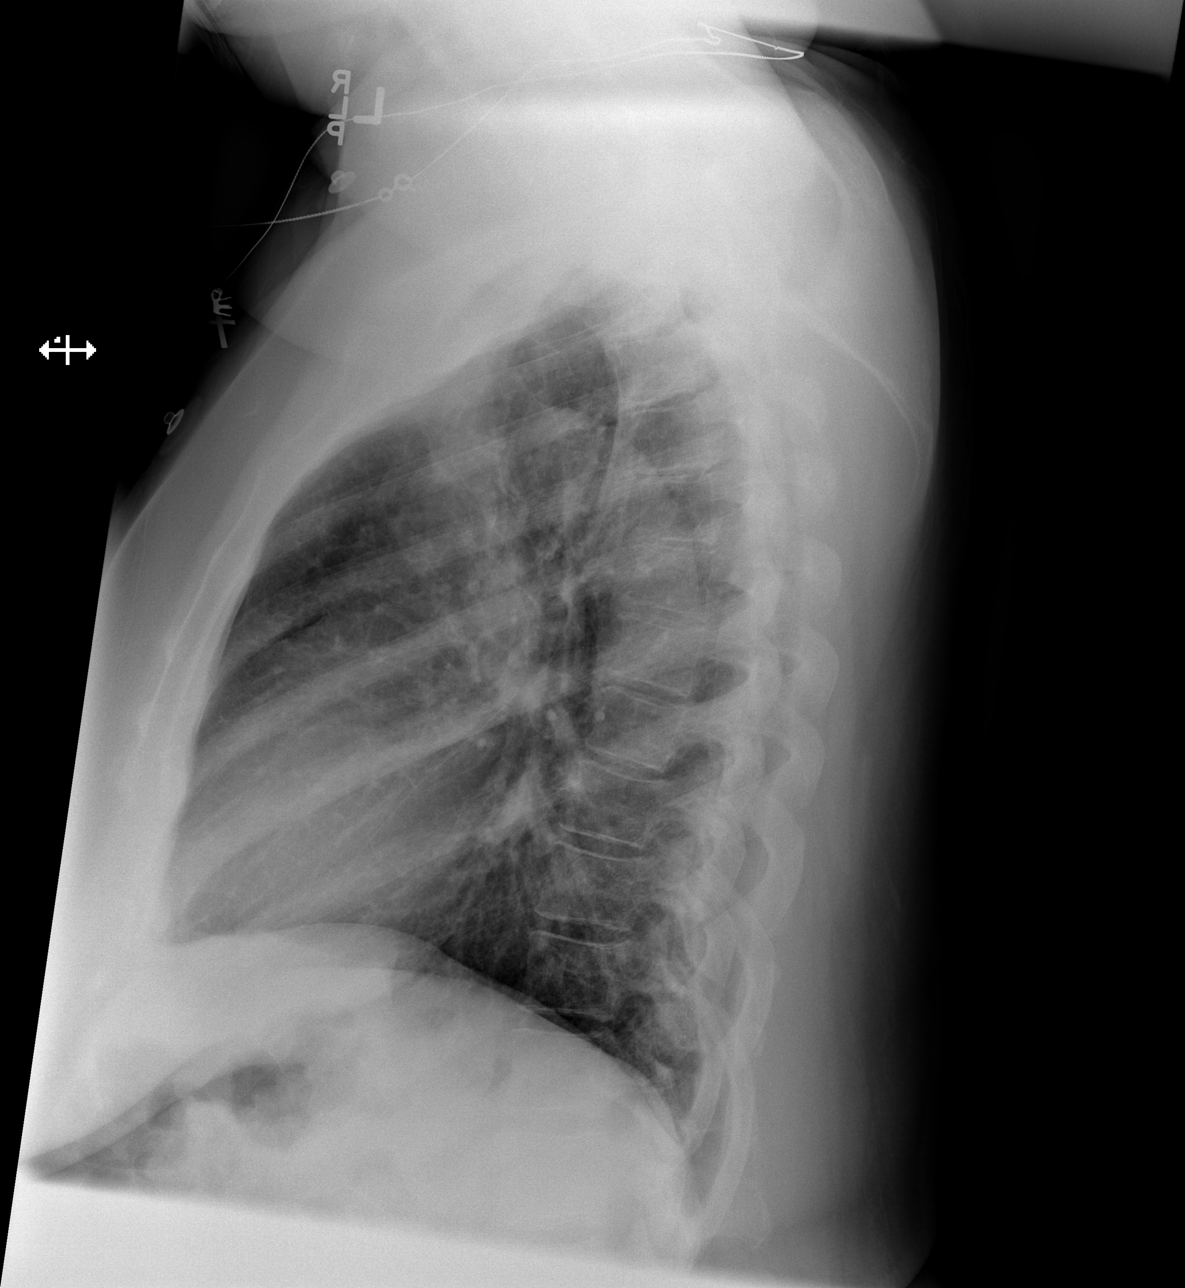

[3 of 3 positions shown; findings below may reference images not displayed]

FINDINGS: There is bullous disease in the right apex. Elsewhere lungs are
clear. Heart is normal in size and contour. Pulmonary vascular is
normal. Aorta is mildly prominent. No bone lesions. No pneumothorax.
No adenopathy.
IMPRESSION: No edema or consolidation. Bullous disease right apex. Aorta is
mildly prominent; suspect chronic hypertensive change.

## 2018-03-31 ENCOUNTER — Other Ambulatory Visit (HOSPITAL_COMMUNITY)
Admission: RE | Admit: 2018-03-31 | Discharge: 2018-03-31 | Disposition: A | Discharge status: Home / Self Care | Payer: 59 | Source: Ambulatory Visit | Attending: Obstetrics and Gynecology | Admitting: Obstetrics and Gynecology

## 2018-03-31 ENCOUNTER — Encounter: Payer: Self-pay | Admitting: Obstetrics and Gynecology

## 2018-03-31 ENCOUNTER — Ambulatory Visit (INDEPENDENT_AMBULATORY_CARE_PROVIDER_SITE_OTHER): Payer: 59 | Admitting: Obstetrics and Gynecology

## 2018-03-31 VITALS — BP 112/78 | Ht 67.0 in | Wt 271.0 lb

## 2018-03-31 DIAGNOSIS — Z01419 Encounter for gynecological examination (general) (routine) without abnormal findings: Secondary | ICD-10-CM

## 2018-03-31 DIAGNOSIS — Z8041 Family history of malignant neoplasm of ovary: Secondary | ICD-10-CM | POA: Diagnosis not present

## 2018-03-31 DIAGNOSIS — Z124 Encounter for screening for malignant neoplasm of cervix: Secondary | ICD-10-CM

## 2018-03-31 DIAGNOSIS — Z803 Family history of malignant neoplasm of breast: Secondary | ICD-10-CM

## 2018-03-31 DIAGNOSIS — F411 Generalized anxiety disorder: Secondary | ICD-10-CM | POA: Insufficient documentation

## 2018-03-31 DIAGNOSIS — E559 Vitamin D deficiency, unspecified: Secondary | ICD-10-CM

## 2018-03-31 NOTE — Patient Instructions (Addendum)
Restless Legs Syndrome Restless legs syndrome is a condition that causes uncomfortable feelings or sensations in the legs, especially while sitting or lying down. The sensations usually cause an overwhelming urge to move the legs. The arms can also sometimes be affected. The condition can range from mild to severe. The symptoms often interfere with a person's ability to sleep. What are the causes? The cause of this condition is not known. What increases the risk? This condition is more likely to develop in:  People who are older than age 63.  Pregnant women. In general, restless legs syndrome is more common in women than in men.  People who have a family history of the condition.  People who have certain medical conditions, such as iron deficiency, kidney disease, Parkinson disease, or nerve damage.  People who take certain medicines, such as medicines for high blood pressure, nausea, colds, allergies, depression, and some heart conditions.  What are the signs or symptoms? The main symptom of this condition is uncomfortable sensations in the legs. These sensations may be:  Described as pulling, tingling, prickling, throbbing, crawling, or burning.  Worse while you are sitting or lying down.  Worse during periods of rest or inactivity.  Worse at night, often interfering with your sleep.  Accompanied by a very strong urge to move your legs.  Temporarily relieved by movement of your legs.  The sensations usually affect both sides of the body. The arms can also be affected, but this is rare. People who have this condition often have tiredness during the day because of their lack of sleep at night. How is this diagnosed? This condition may be diagnosed based on your description of the symptoms. You may also have tests, including blood tests, to check for other conditions that may lead to your symptoms. In some cases, you may be asked to spend some time in a sleep lab so your sleeping  can be monitored. How is this treated? Treatment for this condition is focused on managing the symptoms. Treatment may include:  Self-help and lifestyle changes.  Medicines.  Follow these instructions at home:  Take medicines only as directed by your health care provider.  Try these methods to get temporary relief from the uncomfortable sensations: ? Massage your legs. ? Walk or stretch. ? Take a cold or hot bath.  Practice good sleep habits. For example, go to bed and get up at the same time every day.  Exercise regularly.  Practice ways of relaxing, such as yoga or meditation.  Avoid caffeine and alcohol.  Do not use any tobacco products, including cigarettes, chewing tobacco, or electronic cigarettes. If you need help quitting, ask your health care provider.  Keep all follow-up visits as directed by your health care provider. This is important. Contact a health care provider if: Your symptoms do not improve with treatment, or they get worse. This information is not intended to replace advice given to you by your health care provider. Make sure you discuss any questions you have with your health care provider. Document Released: 07/23/2002 Document Revised: 01/08/2016 Document Reviewed: 07/29/2014 Elsevier Interactive Patient Education  2018 [employer]  Breast Self-Awareness Breast self-awareness means being familiar with how your breasts look and feel. It involves checking your breasts regularly and reporting any changes to your health care provider. Practicing breast self-awareness is important. A change in your breasts can be a sign of a serious medical problem. Being familiar with how your breasts look and feel allows you to find  any problems early, when treatment is more likely to be successful. All women should practice breast self-awareness, including women who have had breast implants. How to do a breast self-exam One way to learn what is normal for your breasts  and whether your breasts are changing is to do a breast self-exam. To do a breast self-exam: Look for Changes  1. Remove all the clothing above your waist. 2. Stand in front of a mirror in a room with good lighting. 3. Put your hands on your hips. 4. Push your hands firmly downward. 5. Compare your breasts in the mirror. Look for differences between them (asymmetry), such as: ? Differences in shape. ? Differences in size. ? Puckers, dips, and bumps in one breast and not the other. 6. Look at each breast for changes in your skin, such as: ? Redness. ? Scaly areas. 7. Look for changes in your nipples, such as: ? Discharge. ? Bleeding. ? Dimpling. ? Redness. ? A change in position. Feel for Changes  Carefully feel your breasts for lumps and changes. It is best to do this while lying on your back on the floor and again while sitting or standing in the shower or tub with soapy water on your skin. Feel each breast in the following way:  Place the arm on the side of the breast you are examining above your head.  Feel your breast with the other hand.  Start in the nipple area and make  inch (2 cm) overlapping circles to feel your breast. Use the pads of your three middle fingers to do this. Apply light pressure, then medium pressure, then firm pressure. The light pressure will allow you to feel the tissue closest to the skin. The medium pressure will allow you to feel the tissue that is a little deeper. The firm pressure will allow you to feel the tissue close to the ribs.  Continue the overlapping circles, moving downward over the breast until you feel your ribs below your breast.  Move one finger-width toward the center of the body. Continue to use the  inch (2 cm) overlapping circles to feel your breast as you move slowly up toward your collarbone.  Continue the up and down exam using all three pressures until you reach your armpit.  Write Down What You Find  Write down what is  normal for each breast and any changes that you find. Keep a written record with breast changes or normal findings for each breast. By writing this information down, you do not need to depend only on memory for size, tenderness, or location. Write down where you are in your menstrual cycle, if you are still menstruating. If you are having trouble noticing differences in your breasts, do not get discouraged. With time you will become more familiar with the variations in your breasts and more comfortable with the exam. How often should I examine my breasts? Examine your breasts every month. If you are breastfeeding, the best time to examine your breasts is after a feeding or after using a breast pump. If you menstruate, the best time to examine your breasts is 5-7 days after your period is over. During your period, your breasts are lumpier, and it may be more difficult to notice changes. When should I see my health care provider? See your health care provider if you notice:  A change in shape or size of your breasts or nipples.  A change in the skin of your breast or nipples, such as  a reddened or scaly area.  Unusual discharge from your nipples.  A lump or thick area that was not there before.  Pain in your breasts.  Anything that concerns you.  This information is not intended to replace advice given to you by your health care provider. Make sure you discuss any questions you have with your health care provider. Document Released: 08/02/2005 Document Revised: 01/08/2016 Document Reviewed: 06/22/2015 Elsevier Interactive Patient Education  [employer].

## 2018-03-31 NOTE — Progress Notes (Signed)
62 y.o. G2P0020 MarriedAfrican AmericanF here for annual exam.   She has hot flashes 3 x a night, better than they were. Overall tolerable.  No vaginal bleeding. Not sexually active secondary to ED.     Patient's last menstrual period was 09/21/2008.          Sexually active: No.  The current method of family planning is tubal ligation.    Exercising: No.  none Smoker:  Yes, less than 1 pack a week. Life is stressful, hasn't been able to quit.    Health Maintenance: Pap:  2010 History of abnormal Pap:  no MMG:  unsure Colonoscopy:  2009 (had a GI bleed), had polyps. Being scheduled.  BMD:   never TDaP:  2013   reports that she has been smoking. She has been smoking about 0.25 packs per day. She has never used smokeless tobacco. She reports that she drank alcohol. She reports that she does not use drugs. She works for [employer] in [employer].   Past Medical History:  Diagnosis Date  . Anxiety   . Blood transfusion without reported diagnosis   . Depression   . Heart disease   . Hypertension   . Infertility, female     Past Surgical History:  Procedure Laterality Date  . TUBAL LIGATION    . WISDOM TOOTH EXTRACTION      Current Outpatient Medications  Medication Sig Dispense Refill  . amLODipine (NORVASC) 10 MG tablet Take 10 mg by mouth daily.  1  . lisinopril-hydrochlorothiazide (PRINZIDE,ZESTORETIC) 10-12.5 MG tablet Take 1 tablet by mouth daily.    . metoprolol tartrate (LOPRESSOR) 25 MG tablet Take 25 mg by mouth daily.    . pravastatin (PRAVACHOL) 10 MG tablet Take 10 mg by mouth daily.  1   No current facility-administered medications for this visit.     Family History  Problem Relation Age of Onset  . Hyperlipidemia Mother   . Diabetes Mother   . Hypertension Brother   . Heart disease Father   . Cancer Sister   . Heart disease Sister   . Seizures Sister     Review of Systems  Constitutional: Negative.   HENT: Negative.   Eyes: Negative.    Respiratory: Negative.   Cardiovascular: Negative.   Gastrointestinal: Positive for diarrhea.  Endocrine: Negative.   Genitourinary: Negative.   Musculoskeletal: Negative.   Skin: Negative.   Allergic/Immunologic: Negative.   Neurological: Negative.   Hematological: Negative.   Psychiatric/Behavioral: Negative.   All other systems reviewed and are negative.   Exam:   BP 112/78   Ht 5\' 7"  (1.702 m)   Wt 271 lb (122.9 kg)   LMP 09/21/2008 Comment: Tubal ligation  BMI 42.44 kg/m   Weight change: @WEIGHTCHANGE @ Height:   Height: 5\' 7"  (170.2 cm)  Ht Readings from Last 3 Encounters:  03/31/18 5\' 7"  (1.702 m)    General appearance: alert, cooperative and appears stated age Head: Normocephalic, without obvious abnormality, atraumatic Neck: no adenopathy, supple, symmetrical, trachea midline and thyroid normal to inspection and palpation Lungs: clear to auscultation bilaterally Cardiovascular: regular rate and rhythm Breasts: normal appearance, no masses or tenderness Abdomen: soft, non-tender; non distended,  no masses,  no organomegaly Extremities: extremities normal, atraumatic, no cyanosis or edema Skin: Skin color, texture, turgor normal. No rashes or lesions Lymph nodes: Cervical, supraclavicular, and axillary nodes normal. No abnormal inguinal nodes palpated Neurologic: Grossly normal   Pelvic: External genitalia:  no lesions  Urethra:  normal appearing urethra with no masses, tenderness or lesions              Bartholins and Skenes: normal                 Vagina: normal appearing vagina with normal color and discharge, no lesions              Cervix: no lesions               Bimanual Exam:  Uterus:  no masses or tenderness, limited by BMI              Adnexa: no mass, fullness, tenderness               Rectovaginal: Confirms               Anus:  normal sphincter tone, no lesions  Chaperone was present for exam.  A:  Well Woman with normal  exam  Sister with ovarian cancer  First cousin with breast cancer  Vit d def in her diet, prior low in lab work  P:   Pap with hpv  Discussed option of a CA 125 and pelvic ultrasounds, no standard of care. Discussed false +  She would like to have the CA 125  Will place consult for genetic counseling.   Discussed breast self exam  Discussed calcium and vit D intake  Vit D

## 2018-04-01 LAB — CA 125: Cancer Antigen (CA) 125: 20.3 U/mL (ref 0.0–38.1)

## 2018-04-01 LAB — VITAMIN D 25 HYDROXY (VIT D DEFICIENCY, FRACTURES): Vit D, 25-Hydroxy: 21.3 ng/mL — ABNORMAL LOW (ref 30.0–100.0)

## 2018-04-03 ENCOUNTER — Telehealth: Payer: Self-pay

## 2018-04-03 LAB — CYTOLOGY - PAP
Adequacy: ABSENT
DIAGNOSIS: NEGATIVE
HPV (WINDOPATH): NOT DETECTED

## 2018-04-03 NOTE — Telephone Encounter (Signed)
-----   Message from Romualdo BolkJill Evelyn Jertson, MD sent at 04/03/2018 10:04 AM EDT ----- Please inform the patient that her vit d is low and have her start on 1,000 IU of vit d 3 daily (long term). CA 125 is normal

## 2018-04-03 NOTE — Telephone Encounter (Signed)
Left message to call Ethleen Lormand at [phone]. 

## 2018-04-05 NOTE — Telephone Encounter (Signed)
Spoke with patient. Advised of results as seen below. Patient verbalizes understanding. Encounter closed. 

## 2018-09-05 ENCOUNTER — Other Ambulatory Visit: Payer: Self-pay | Admitting: Family Medicine

## 2018-09-05 DIAGNOSIS — Z1231 Encounter for screening mammogram for malignant neoplasm of breast: Secondary | ICD-10-CM

## 2019-04-10 NOTE — Progress Notes (Deleted)
63 y.o. G73P0020 Married Black or [place] American Not Hispanic or Latino female here for annual exam.      Patient's last menstrual period was 09/21/2008.          Sexually active: {yes no:314532}  The current method of family planning is {contraception:315051}.    Exercising: {yes no:314532}  {types:19826} Smoker:  {YES [id number]  Health Maintenance: Pap:  03/31/2018 WNL NEG HPV History of abnormal Pap:  no MMG:  unsure Colonoscopy:  2009 (had a GI bleed), had polyps. Being scheduled.  BMD:   never TDaP:  2013   reports that she has been smoking. She has been smoking about 0.25 packs per day. She has never used smokeless tobacco. She reports previous alcohol use. She reports that she does not use drugs.  Past Medical History:  Diagnosis Date  . Anxiety   . Blood transfusion without reported diagnosis   . Depression   . Heart disease   . Hypertension   . Infertility, female     Past Surgical History:  Procedure Laterality Date  . TUBAL LIGATION    . WISDOM TOOTH EXTRACTION      Current Outpatient Medications  Medication Sig Dispense Refill  . amLODipine (NORVASC) 10 MG tablet Take 10 mg by mouth daily.  1  . lisinopril-hydrochlorothiazide (PRINZIDE,ZESTORETIC) 10-12.5 MG tablet Take 1 tablet by mouth daily.    . metoprolol tartrate (LOPRESSOR) 25 MG tablet Take 25 mg by mouth daily.    . pravastatin (PRAVACHOL) 10 MG tablet Take 10 mg by mouth daily.  1   No current facility-administered medications for this visit.     Family History  Problem Relation Age of Onset  . Hyperlipidemia Mother   . Diabetes Mother   . Hypertension Brother   . Heart disease Father   . Ovarian cancer Sister 27  . Heart disease Sister   . Seizures Sister   . Breast cancer Cousin 85       maternal first cousin    Review of Systems  Exam:   LMP 09/21/2008 Comment: Tubal ligation  Weight change: @WEIGHTCHANGE @ Height:      Ht Readings from Last 3 Encounters:  03/31/18 5\' 7"  (1.702 m)     General appearance: alert, cooperative and appears stated age Head: Normocephalic, without obvious abnormality, atraumatic Neck: no adenopathy, supple, symmetrical, trachea midline and thyroid {CHL AMB PHY EX THYROID NORM DEFAULT:[phone]::"normal to inspection and palpation"} Lungs: clear to auscultation bilaterally Cardiovascular: regular rate and rhythm Breasts: {Exam; breast:13139::"normal appearance, no masses or tenderness"} Abdomen: soft, non-tender; non distended,  no masses,  no organomegaly Extremities: extremities normal, atraumatic, no cyanosis or edema Skin: Skin color, texture, turgor normal. No rashes or lesions Lymph nodes: Cervical, supraclavicular, and axillary nodes normal. No abnormal inguinal nodes palpated Neurologic: Grossly normal   Pelvic: External genitalia:  no lesions              Urethra:  normal appearing urethra with no masses, tenderness or lesions              Bartholins and Skenes: normal                 Vagina: normal appearing vagina with normal color and discharge, no lesions              Cervix: {CHL AMB PHY EX CERVIX NORM [phone]::"no lesions"}               Bimanual Exam:  Uterus:  {CHL AMB PHY EX  UTERUS NORM [phone]::"normal size, contour, position, consistency, mobility, non-tender"}              Adnexa: {CHL AMB PHY EX ADNEXA NO MASS DEFAULT:[phone]::"no mass, fullness, tenderness"}               Rectovaginal: Confirms               Anus:  normal sphincter tone, no lesions  Chaperone was present for exam.  A:  Well Woman with normal exam  P:

## 2019-04-11 ENCOUNTER — Encounter: Payer: Self-pay | Admitting: Obstetrics and Gynecology

## 2019-04-11 ENCOUNTER — Ambulatory Visit: Payer: 59 | Admitting: Obstetrics and Gynecology

## 2019-11-17 ENCOUNTER — Ambulatory Visit: Payer: 59 | Attending: Internal Medicine

## 2019-11-17 DIAGNOSIS — Z23 Encounter for immunization: Secondary | ICD-10-CM

## 2019-11-17 NOTE — Progress Notes (Signed)
   Covid-19 Vaccination Clinic  Name:  Skyleigh Windle    MRN: [id number] DOB: Oct 27, 1955  11/17/2019  [name] was observed post Covid-19 immunization for 15 minutes without incident. She was provided with Vaccine Information Sheet and instruction to access the V-Safe system.   Ms. Ledesma was instructed to call 911 with any severe reactions post vaccine: Marland Kitchen Difficulty breathing  . Swelling of face and throat  . A fast heartbeat  . A bad rash all over body  . Dizziness and weakness   Immunizations Administered    Name Date Dose VIS Date Route   Pfizer COVID-19 Vaccine 11/17/2019  8:19 AM 0.3 mL 07/27/2019 Intramuscular   Manufacturer: [employer], [employer]   Lot: [id number]   NDC: [address]-2

## 2019-12-12 ENCOUNTER — Ambulatory Visit: Payer: 59 | Attending: Internal Medicine

## 2019-12-12 DIAGNOSIS — Z23 Encounter for immunization: Secondary | ICD-10-CM

## 2019-12-12 NOTE — Progress Notes (Signed)
   Covid-19 Vaccination Clinic  Name:  Carla Tyler    MRN: [id number] DOB: Feb 09, 1956  12/12/2019  [name] was observed post Covid-19 immunization for 15 minutes without incident. She was provided with Vaccine Information Sheet and instruction to access the V-Safe system.   [name] was instructed to call 911 with any severe reactions post vaccine: Marland Kitchen Difficulty breathing  . Swelling of face and throat  . A fast heartbeat  . A bad rash all over body  . Dizziness and weakness   Immunizations Administered    Name Date Dose VIS Date Route   Pfizer COVID-19 Vaccine 12/12/2019  8:33 AM 0.3 mL 10/10/2018 Intramuscular   Manufacturer: [employer], [employer]   Lot: [id number]   NDC: [address]-2

## 2020-06-14 ENCOUNTER — Ambulatory Visit: Payer: 59 | Attending: Internal Medicine

## 2020-06-14 DIAGNOSIS — Z23 Encounter for immunization: Secondary | ICD-10-CM

## 2020-06-14 NOTE — Progress Notes (Signed)
   Covid-19 Vaccination Clinic  Name:  Carla Tyler    MRN: [id number] DOB: 1956-07-13  06/14/2020  Carla Tyler was observed post Covid-19 immunization for 15 minutes without incident. She was provided with Vaccine Information Sheet and instruction to access the V-Safe system.   Carla Tyler was instructed to call 911 with any severe reactions post vaccine: Marland Kitchen Difficulty breathing  . Swelling of face and throat  . A fast heartbeat  . A bad rash all over body  . Dizziness and weakness

## 2022-02-08 ENCOUNTER — Other Ambulatory Visit: Payer: Self-pay

## 2022-02-08 DIAGNOSIS — K5010 Crohn's disease of large intestine without complications: Secondary | ICD-10-CM | POA: Insufficient documentation

## 2022-02-09 ENCOUNTER — Telehealth: Payer: Self-pay | Admitting: Pharmacy Technician

## 2022-02-15 ENCOUNTER — Ambulatory Visit (INDEPENDENT_AMBULATORY_CARE_PROVIDER_SITE_OTHER): Payer: 59

## 2022-02-15 VITALS — BP 95/62 | HR 79 | Temp 97.9°F | Resp 18 | Ht 69.0 in | Wt 266.2 lb

## 2022-02-15 DIAGNOSIS — K5010 Crohn's disease of large intestine without complications: Secondary | ICD-10-CM | POA: Diagnosis not present

## 2022-02-15 MED ORDER — RISANKIZUMAB-RZAA 600 MG/10ML IV SOLN
600.0000 mg | Freq: Once | INTRAVENOUS | Status: AC
  Administered 2022-02-15: 600 mg via INTRAVENOUS
  Filled 2022-02-15: qty 10

## 2022-02-15 NOTE — Progress Notes (Signed)
Diagnosis: Crohn's Disease  Provider:  Chilton Greathouse, MD  Procedure: Infusion  IV Type: Peripheral, IV Location: R Hand  skyrizi, Dose: 600mg   Infusion Start Time: 1107  Infusion Stop Time: 1213  Post Infusion IV Care: Observation period completed and Peripheral IV Discontinued  Discharge: Condition: Good, Destination: Home . AVS provided to patient.   Performed by:  , LPN

## 2022-03-15 ENCOUNTER — Ambulatory Visit (INDEPENDENT_AMBULATORY_CARE_PROVIDER_SITE_OTHER): Payer: 59

## 2022-03-15 VITALS — BP 109/75 | HR 68 | Temp 98.0°F | Resp 18 | Ht 69.0 in | Wt 269.2 lb

## 2022-03-15 DIAGNOSIS — K5010 Crohn's disease of large intestine without complications: Secondary | ICD-10-CM | POA: Diagnosis not present

## 2022-03-15 MED ORDER — RISANKIZUMAB-RZAA 600 MG/10ML IV SOLN
600.0000 mg | Freq: Once | INTRAVENOUS | Status: AC
  Administered 2022-03-15: 600 mg via INTRAVENOUS
  Filled 2022-03-15: qty 10

## 2022-03-15 NOTE — Progress Notes (Signed)
Diagnosis: Crohn's Disease  Provider:  Chilton Greathouse, MD  Procedure: Infusion  IV Type: Peripheral, IV Location: R Hand  SKYRIZI,  Dose: 600 mg  Infusion Start Time: 0946  Infusion Stop Time: 1055  Post Infusion IV Care: Peripheral IV Discontinued  Discharge: Condition: Good, Destination: Home . AVS Declined  Performed by:  Garnette Czech, RN

## 2022-04-12 ENCOUNTER — Ambulatory Visit (INDEPENDENT_AMBULATORY_CARE_PROVIDER_SITE_OTHER): Payer: 59

## 2022-04-12 VITALS — BP 111/79 | HR 64 | Temp 97.9°F | Resp 18 | Ht 69.0 in | Wt 273.4 lb

## 2022-04-12 DIAGNOSIS — K5010 Crohn's disease of large intestine without complications: Secondary | ICD-10-CM

## 2022-04-12 MED ORDER — RISANKIZUMAB-RZAA 600 MG/10ML IV SOLN
600.0000 mg | Freq: Once | INTRAVENOUS | Status: AC
  Administered 2022-04-12: 600 mg via INTRAVENOUS
  Filled 2022-04-12: qty 10

## 2022-04-12 NOTE — Progress Notes (Signed)
Diagnosis: Crohn's Disease  Provider:  Chilton Greathouse MD  Procedure: Infusion  IV Type: Peripheral, IV Location: R Hand  Skyriz, Dose: 600  Infusion Start Time: 1008  Infusion Stop Time: 1128  Post Infusion IV Care: Peripheral IV Discontinued  Discharge: Condition: Good, Destination: Home . AVS provided to patient.   Performed by:  Adriana Mccallum, RN

## 2022-04-12 NOTE — Patient Instructions (Signed)
Risankizumab Injection What is this medication? RISANKIZUMAB (RIS an KIZ ue mab) treats autoimmune conditions, such as psoriasis, arthritis, and Crohn's disease. It works by slowing down an overactive immune system. It is a monoclonal antibody. This medicine may be used for other purposes; ask your health care provider or pharmacist if you have questions. COMMON BRAND NAME(S): Skyrizi What should I tell my care team before I take this medication? They need to know if you have any of these conditions: Hepatic disease Immune system problems Infection, such as tuberculosis (TB), bacterial, fungal or viral infections Recent or upcoming vaccine An unusual or allergic reaction to risankizumab, other medications, foods, dyes, or preservatives Pregnant or trying to get pregnant Breast-feeding How should I use this medication? This medication is injected into a vein or under the skin. It is given by your care team in a hospital or clinic setting. It may also be given at home. If you get this medication at home, you will be taught how to prepare and give it. Use exactly as directed. Take it as directed on the prescription label. Keep taking it unless your care team tells you to stop. If you use a pen, be sure to take off the outer needle cover before using the dose. It is important that you put your used needles and syringes in a special sharps container. Do not put them in a trash can. If you do not have a sharps container, call your pharmacist or care team to get one. A special MedGuide will be given to you by the pharmacist with each prescription and refill. Be sure to read this information carefully each time. This medication comes with INSTRUCTIONS FOR USE. Ask your pharmacist for directions on how to use this medication. Read the information carefully. Talk to your pharmacist or care team if you have questions. Talk to your care team about the use of this medication in children. Special care may be  needed. Overdosage: If you think you have taken too much of this medicine contact a poison control center or emergency room at once. NOTE: This medicine is only for you. Do not share this medicine with others. What if I miss a dose? It is important not to miss any doses. Talk to your care team about what to do if you miss a dose. What may interact with this medication? Do not take this medication with any of the following: Live vaccines This list may not describe all possible interactions. Give your health care provider a list of all the medicines, herbs, non-prescription drugs, or dietary supplements you use. Also tell them if you smoke, drink alcohol, or use illegal drugs. Some items may interact with your medicine. What should I watch for while using this medication? Visit your care team for regular checks on your progress. Tell your care team if your symptoms do not start to get better or if they get worse. You will be tested for tuberculosis (TB) before you start this medication. If your care team prescribes any medication for TB, you should start taking the TB medication before starting this medication. Make sure to finish the full course of TB medication. This medication may increase your risk of getting an infection. Call your care team for advice if you get a fever, chills, sore throat, or other symptoms of a cold or flu. Do not treat yourself. Try to avoid being around people who are sick. This medication can decrease the response to a vaccine. If you need to get   vaccinated, tell your care team if you have received this medication. Extra booster doses may be needed. Talk to your care team to see if a different vaccination schedule is needed. What side effects may I notice from receiving this medication? Side effects that you should report to your care team as soon as possible: Allergic reactions--skin rash, itching, hives, swelling of the face, lips, tongue, or throat Infection--fever,  chills, cough, sore throat, wounds that don't heal, pain or trouble when passing urine, general feeling of discomfort or being unwell Liver injury--right upper belly pain, loss of appetite, nausea, light-colored stool, dark yellow or brown urine, yellowing skin or eyes, unusual weakness or fatigue Side effects that usually do not require medical attention (report to your care team if they continue or are bothersome): Fatigue Headache Pain, redness, or irritation at injection site Runny or stuffy nose Sore throat This list may not describe all possible side effects. Call your doctor for medical advice about side effects. You may report side effects to FDA at 1-800-FDA-1088. Where should I keep my medication? Keep out of the reach of children and pets. Store in a refrigerator. Do not freeze. Protect from light. Keep it in the original carton until you are ready to take it. See product for storage information. Each product may have different instructions. Remove the dose from the carton about 30 to 45 minutes before it is time for you to take it. Get rid of any unused medication after the expiration date. To get rid of medications that are no longer needed or have expired: Take the medication to a medication take-back program. Check with your pharmacy or law enforcement to find a location. If you cannot return the medication, ask your pharmacist or care team how to get rid of this medication safely. NOTE: This sheet is a summary. It may not cover all possible information. If you have questions about this medicine, talk to your doctor, pharmacist, or health care provider.  2023 Elsevier/Gold Standard (2021-10-14 00:00:00)  

## 2022-04-13 ENCOUNTER — Telehealth: Payer: Self-pay | Admitting: Pharmacy Technician

## 2022-04-13 NOTE — Telephone Encounter (Signed)
Skyrizi Hydrologist  F/u:  patient has completed induction dosing. @[place] , please submit PA for on-body injector.  Thanks [employer]

## 2022-04-15 ENCOUNTER — Telehealth: Payer: Self-pay | Admitting: Pharmacy Technician

## 2022-04-15 ENCOUNTER — Other Ambulatory Visit (HOSPITAL_COMMUNITY): Payer: Self-pay

## 2022-04-15 NOTE — Telephone Encounter (Addendum)
Patient Advocate Encounter  Received notification that prior authorization for SKYRIZI 180MG  is required.   PA submitted on 8.31.23 Key [id number] Status is pending    01-13-1975, CPhT Patient Advocate Phone: [phone]

## 2022-04-26 ENCOUNTER — Other Ambulatory Visit (HOSPITAL_COMMUNITY): Payer: Self-pay

## 2022-04-26 ENCOUNTER — Other Ambulatory Visit: Payer: Self-pay | Admitting: Pharmacy Technician

## 2022-04-26 NOTE — Telephone Encounter (Signed)
I sent a template in TEAMS. I had spoken with Carla Tyler as well and she suggested I could try sending the [facility] for the 3 infusion given in the clinic as chart notes and try resubmitting that way. Please let me know your thoughts or if there is more that I can do.

## 2022-04-26 NOTE — Telephone Encounter (Signed)
Received a fax regarding Prior Authorization from [facility] for  [facility]. Authorization has been DENIED because pt did not meet criteria.

## 2022-04-27 NOTE — Telephone Encounter (Signed)
Appeal letter and supporting documents have been submitted via fax to [facility] [phone].

## 2022-04-30 ENCOUNTER — Other Ambulatory Visit (HOSPITAL_COMMUNITY): Payer: Self-pay

## 2022-05-04 ENCOUNTER — Other Ambulatory Visit (HOSPITAL_COMMUNITY): Payer: Self-pay

## 2022-05-04 NOTE — Telephone Encounter (Signed)
Appeal has been approved. Effective dates are 9.19.23 to 9.19.24. Reference number is [id number]. Pt will need to fill with specialty pharmacy such or [facility]. Called and spoke with Sheria Lang" at [facility] and pt to provide update.

## 2022-05-12 ENCOUNTER — Other Ambulatory Visit (HOSPITAL_COMMUNITY): Payer: Self-pay

## 2024-07-21 ENCOUNTER — Encounter (HOSPITAL_BASED_OUTPATIENT_CLINIC_OR_DEPARTMENT_OTHER): Payer: Self-pay

## 2024-07-21 ENCOUNTER — Emergency Department (HOSPITAL_BASED_OUTPATIENT_CLINIC_OR_DEPARTMENT_OTHER): Admitting: Radiology

## 2024-07-21 ENCOUNTER — Emergency Department (HOSPITAL_BASED_OUTPATIENT_CLINIC_OR_DEPARTMENT_OTHER)
Admission: EM | Admit: 2024-07-21 | Discharge: 2024-07-21 | Disposition: A | Discharge status: Home / Self Care | Attending: Emergency Medicine | Admitting: Emergency Medicine

## 2024-07-21 ENCOUNTER — Other Ambulatory Visit: Payer: Self-pay

## 2024-07-21 DIAGNOSIS — J189 Pneumonia, unspecified organism: Secondary | ICD-10-CM

## 2024-07-21 DIAGNOSIS — J181 Lobar pneumonia, unspecified organism: Secondary | ICD-10-CM | POA: Insufficient documentation

## 2024-07-21 HISTORY — DX: Nonrheumatic mitral (valve) prolapse: I34.1

## 2024-07-21 HISTORY — DX: Crohn's disease of large intestine without complications: K50.10

## 2024-07-21 LAB — CBC WITH DIFFERENTIAL/PLATELET
Abs Immature Granulocytes: 0.02 K/uL (ref 0.00–0.07)
Basophils Absolute: 0 K/uL (ref 0.0–0.1)
Basophils Relative: 0 %
Eosinophils Absolute: 0.1 K/uL (ref 0.0–0.5)
Eosinophils Relative: 1 %
HCT: 38 % (ref 36.0–46.0)
Hemoglobin: 12.1 g/dL (ref 12.0–15.0)
Immature Granulocytes: 0 %
Lymphocytes Relative: 33 %
Lymphs Abs: 3.2 K/uL (ref 0.7–4.0)
MCH: 28.9 pg (ref 26.0–34.0)
MCHC: 31.8 g/dL (ref 30.0–36.0)
MCV: 90.7 fL (ref 80.0–100.0)
Monocytes Absolute: 0.6 K/uL (ref 0.1–1.0)
Monocytes Relative: 6 %
Neutro Abs: 5.7 K/uL (ref 1.7–7.7)
Neutrophils Relative %: 60 %
Platelets: 270 K/uL (ref 150–400)
RBC: 4.19 MIL/uL (ref 3.87–5.11)
RDW: 13 % (ref 11.5–15.5)
WBC: 9.7 K/uL (ref 4.0–10.5)
nRBC: 0 % (ref 0.0–0.2)

## 2024-07-21 LAB — BASIC METABOLIC PANEL WITH GFR
Anion gap: 11 (ref 5–15)
BUN: 15 mg/dL (ref 8–23)
CO2: 26 mmol/L (ref 22–32)
Calcium: 9.5 mg/dL (ref 8.9–10.3)
Chloride: 100 mmol/L (ref 98–111)
Creatinine, Ser: 1.13 mg/dL — ABNORMAL HIGH (ref 0.44–1.00)
GFR, Estimated: 52 mL/min — ABNORMAL LOW (ref >60)
Glucose, Bld: 114 mg/dL — ABNORMAL HIGH (ref 70–99)
Potassium: 3.8 mmol/L (ref 3.5–5.1)
Sodium: 137 mmol/L (ref 135–145)

## 2024-07-21 MED ORDER — SODIUM CHLORIDE 0.9 % IV SOLN
1.0000 g | Freq: Once | INTRAVENOUS | Status: AC
  Administered 2024-07-21: 1 g via INTRAVENOUS
  Filled 2024-07-21: qty 10

## 2024-07-21 MED ORDER — DOXYCYCLINE HYCLATE 100 MG PO CAPS: 100.0000 mg | ORAL_CAPSULE | Freq: Two times a day (BID) | ORAL | 0 refills | Status: AC

## 2024-07-21 MED ORDER — GUAIFENESIN ER 600 MG PO TB12: 1200.0000 mg | ORAL_TABLET | Freq: Once | ORAL | Status: DC

## 2024-07-21 MED ORDER — DOXYCYCLINE HYCLATE 100 MG PO TABS
100.0000 mg | ORAL_TABLET | Freq: Once | ORAL | Status: AC
  Administered 2024-07-21: 100 mg via ORAL
  Filled 2024-07-21: qty 1

## 2024-07-21 MED ORDER — CEPHALEXIN 500 MG PO CAPS: 500.0000 mg | ORAL_CAPSULE | Freq: Two times a day (BID) | ORAL | 0 refills | Status: AC

## 2024-07-21 MED ORDER — GUAIFENESIN 200 MG PO TABS: 200.0000 mg | ORAL_TABLET | ORAL | 0 refills | Status: DC | PRN

## 2024-07-21 MED ORDER — GUAIFENESIN 100 MG/5ML PO LIQD
5.0000 mL | Freq: Once | ORAL | Status: AC
  Administered 2024-07-21: 5 mL via ORAL
  Filled 2024-07-21: qty 10

## 2024-07-21 MED ORDER — CEPHALEXIN 500 MG PO CAPS: 500.0000 mg | ORAL_CAPSULE | Freq: Two times a day (BID) | ORAL | 0 refills | Status: DC

## 2024-07-21 MED ORDER — DOXYCYCLINE HYCLATE 100 MG PO CAPS: 100.0000 mg | ORAL_CAPSULE | Freq: Two times a day (BID) | ORAL | 0 refills | Status: DC

## 2024-07-21 MED ORDER — GUAIFENESIN 200 MG PO TABS: 200.0000 mg | ORAL_TABLET | Freq: Four times a day (QID) | ORAL | 0 refills | Status: AC | PRN

## 2024-07-21 NOTE — ED Notes (Signed)
 Mucinex  not on formulary. MD notified.

## 2024-07-21 NOTE — ED Notes (Signed)
 ED Provider at bedside.

## 2024-07-21 NOTE — ED Provider Notes (Signed)
 [place] [facility] AT [facility] Provider Note   CSN: [id number] Arrival date & time: 07/21/24  9297     Patient presents with: Cough   Carla Tyler is a 68 y.o. female.   HPI     68 year old patient comes in with chief complaint of persistent cough.  Patient states that she had URI-like symptoms last week, which resolved, but now she has a cough that is worsening.  Cough however is dry.  She feels that there is mucus that is stuck in her throat.  She denies any fevers, chills.  Patient has history of Crohn's disease and is on immunosuppressive medications for it.  Patient denies any nausea, vomiting.  Prior to Admission medications   Medication Sig Start Date End Date Taking? Authorizing Provider  amLODipine (NORVASC) 10 MG tablet Take 10 mg by mouth daily. 03/09/18   [provider]  cephALEXin  (KEFLEX ) 500 MG capsule Take 1 capsule (500 mg total) by mouth 2 (two) times daily. 07/21/24   Charlyn Sora, MD  doxycycline  (VIBRAMYCIN ) 100 MG capsule Take 1 capsule (100 mg total) by mouth 2 (two) times daily. 07/21/24   Charlyn Sora, MD  guaiFENesin  200 MG tablet Take 1 tablet (200 mg total) by mouth every 6 (six) hours as needed for cough or to loosen phlegm. 07/21/24   Charlyn Sora, MD  lisinopril-hydrochlorothiazide (PRINZIDE,ZESTORETIC) 10-12.5 MG tablet Take 1 tablet by mouth daily.    [provider]  metoprolol tartrate (LOPRESSOR) 25 MG tablet Take 25 mg by mouth daily. 08/29/14   [provider]  pravastatin (PRAVACHOL) 10 MG tablet Take 10 mg by mouth daily. 03/09/18   [provider]    Allergies: Aspirin and Penicillins    Review of Systems  All other systems reviewed and are negative.   Updated Vital Signs BP 119/87   Pulse 83   Temp 98.1 F (36.7 C) (Oral)   Resp 20   Ht 5' 9 (1.753 m)   Wt 123.4 kg   LMP 09/21/2008 Comment: Tubal ligation  SpO2 99%   BMI 40.17 kg/m   Physical  Exam Vitals and nursing note reviewed.  Constitutional:      Appearance: She is well-developed.  HENT:     Head: Atraumatic.  Cardiovascular:     Rate and Rhythm: Normal rate.  Pulmonary:     Effort: Pulmonary effort is normal.     Breath sounds: Rales present.     Comments: Left lower lung field mild rhonchi with rales Musculoskeletal:     Cervical back: Normal range of motion and neck supple.  Skin:    General: Skin is warm and dry.  Neurological:     Mental Status: She is alert and oriented to person, place, and time.     (all labs ordered are listed, but only abnormal results are displayed) Labs Reviewed  BASIC METABOLIC PANEL WITH GFR - Abnormal; Notable for the following components:      Result Value   Glucose, Bld 114 (*)    Creatinine, Ser 1.13 (*)    GFR, Estimated 52 (*)    All other components within normal limits  CBC WITH DIFFERENTIAL/PLATELET    EKG: None  Radiology: DG Chest 2 View Result Date: 07/21/2024 EXAM: 2 VIEW(S) XRAY OF THE CHEST 07/21/2024 08:42:27 AM COMPARISON: None available. CLINICAL HISTORY: L sided rales with cough FINDINGS: LUNGS AND PLEURA: Hazy airspace opacity at left lung base. No pleural effusion. No pneumothorax. HEART AND MEDIASTINUM: Calcified aorta. No acute  abnormality of the cardiac silhouette. BONES AND SOFT TISSUES: No acute osseous abnormality. IMPRESSION: 1. Hazy airspace opacity at the left lung base, consistent with pneumonia Electronically signed by: Waddell Calk MD 07/21/2024 09:09 AM EST RP Workstation: [id number]     Procedures   Medications Ordered in the ED  guaiFENesin  (ROBITUSSIN) 100 MG/5ML liquid 5 mL (5 mLs Oral Given 07/21/24 0909)  cefTRIAXone  (ROCEPHIN ) 1 g in sodium chloride  0.9 % 100 mL IVPB (0 g Intravenous Stopped 07/21/24 1109)  doxycycline  (VIBRA -TABS) tablet 100 mg (100 mg Oral Given 07/21/24 1013)                                    Medical Decision Making Amount and/or Complexity of Data  Reviewed Labs: ordered. Radiology: ordered.  Risk OTC drugs. Prescription drug management.   68 year old female comes in with chief complaint of persistent cough.  She had URI-like symptoms a week ago, which has resolved, but now she has a persistent cough with some chest tightness.  On exam, patient noted to have some left lower lung field consolidation.  Differential diagnosis includes postviral cough, bronchitis, pneumonia.  Patient smokes about half a pack a day.  Currently she has no productive phlegm and at no point did she have hemoptysis.  X-ray of the chest ordered and independently interpreted by me.  There is clear evidence of left-sided opacification.  Clinical suspicion for pneumonia at this time.  We will start her on antibiotics and advised PCP follow-up in 1 week to ensure that the opacification has resolved.  If it is not getting better, then she might need further workup given the tobacco use disorder.  Smoking cessation instruction/counseling given:  counseled patient on the dangers of tobacco use, advised patient to stop smoking, and reviewed strategies to maximize success   Final diagnoses:  Community acquired pneumonia of left lower lobe of lung    ED Discharge Orders          Ordered    cephALEXin  (KEFLEX ) 500 MG capsule  2 times daily,   Status:  Discontinued        07/21/24 1000    doxycycline  (VIBRAMYCIN ) 100 MG capsule  2 times daily,   Status:  Discontinued        07/21/24 1000    cephALEXin  (KEFLEX ) 500 MG capsule  2 times daily        07/21/24 1116    doxycycline  (VIBRAMYCIN ) 100 MG capsule  2 times daily        07/21/24 1116    guaiFENesin  200 MG tablet  Every 4 hours PRN,   Status:  Discontinued        07/21/24 1117    guaiFENesin  200 MG tablet  Every 6 hours PRN        07/21/24 1117               Charlyn Sora, MD 07/21/24 1121

## 2024-07-21 NOTE — Discharge Instructions (Addendum)
 The x-ray of your chest confirms pneumonia.  Please take the antibiotics that I prescribed.  Return to the emergency room if you start having worsening chest pain, shortness of breath, fevers, chills, severe weakness.

## 2024-07-21 NOTE — ED Notes (Signed)
 Patient transported to X-ray

## 2024-07-21 NOTE — ED Triage Notes (Signed)
 Presents to ED for persistent dry, strong cough. Ongoing for approx 5 days. Denies other sick symptoms. Pt reports that during a coughing fit she gets SOB and has pain.
